# Patient Record
Sex: Female | Born: 1965 | Race: Black or African American | Hispanic: No | Marital: Single | Smoking: Never smoker
Health system: Southern US, Community
[De-identification: ages and names within clinical notes are randomized; demographics above are authoritative.]

## PROBLEM LIST (undated history)

## (undated) DIAGNOSIS — M5126 Other intervertebral disc displacement, lumbar region: Secondary | ICD-10-CM

## (undated) DIAGNOSIS — I2699 Other pulmonary embolism without acute cor pulmonale: Secondary | ICD-10-CM

## (undated) DIAGNOSIS — I10 Essential (primary) hypertension: Secondary | ICD-10-CM

## (undated) DIAGNOSIS — I519 Heart disease, unspecified: Secondary | ICD-10-CM

## (undated) DIAGNOSIS — I82409 Acute embolism and thrombosis of unspecified deep veins of unspecified lower extremity: Secondary | ICD-10-CM

## (undated) HISTORY — PX: BACK SURGERY: SHX140

## (undated) HISTORY — PX: ABDOMINAL HYSTERECTOMY: SHX81

## (undated) HISTORY — PX: OTHER SURGICAL HISTORY: SHX169

## (undated) HISTORY — PX: LUMBAR FUSION: SHX111

---

## 1998-04-12 ENCOUNTER — Encounter: Admission: RE | Admit: 1998-04-12 | Discharge: 1998-04-12 | Payer: Self-pay | Admitting: *Deleted

## 2000-06-09 ENCOUNTER — Encounter: Payer: Self-pay | Admitting: Emergency Medicine

## 2000-06-09 ENCOUNTER — Emergency Department (HOSPITAL_COMMUNITY): Admission: EM | Admit: 2000-06-09 | Discharge: 2000-06-09 | Payer: Self-pay | Admitting: Emergency Medicine

## 2000-06-25 ENCOUNTER — Emergency Department (HOSPITAL_COMMUNITY): Admission: EM | Admit: 2000-06-25 | Discharge: 2000-06-25 | Payer: Self-pay

## 2000-08-14 ENCOUNTER — Emergency Department (HOSPITAL_COMMUNITY): Admission: EM | Admit: 2000-08-14 | Discharge: 2000-08-14 | Payer: Self-pay | Admitting: Emergency Medicine

## 2000-08-18 ENCOUNTER — Emergency Department (HOSPITAL_COMMUNITY): Admission: EM | Admit: 2000-08-18 | Discharge: 2000-08-18 | Payer: Self-pay

## 2000-08-18 ENCOUNTER — Encounter: Payer: Self-pay | Admitting: Emergency Medicine

## 2000-12-08 ENCOUNTER — Emergency Department (HOSPITAL_COMMUNITY): Admission: EM | Admit: 2000-12-08 | Discharge: 2000-12-08 | Payer: Self-pay | Admitting: Emergency Medicine

## 2000-12-08 ENCOUNTER — Encounter: Payer: Self-pay | Admitting: Emergency Medicine

## 2001-05-10 ENCOUNTER — Emergency Department (HOSPITAL_COMMUNITY): Admission: EM | Admit: 2001-05-10 | Discharge: 2001-05-11 | Payer: Self-pay | Admitting: Emergency Medicine

## 2001-07-16 ENCOUNTER — Emergency Department (HOSPITAL_COMMUNITY): Admission: EM | Admit: 2001-07-16 | Discharge: 2001-07-16 | Payer: Self-pay | Admitting: Emergency Medicine

## 2002-01-17 ENCOUNTER — Emergency Department (HOSPITAL_COMMUNITY): Admission: EM | Admit: 2002-01-17 | Discharge: 2002-01-17 | Payer: Self-pay | Admitting: *Deleted

## 2002-06-24 ENCOUNTER — Emergency Department (HOSPITAL_COMMUNITY): Admission: EM | Admit: 2002-06-24 | Discharge: 2002-06-24 | Payer: Self-pay | Admitting: Emergency Medicine

## 2002-11-03 ENCOUNTER — Encounter: Payer: Self-pay | Admitting: Emergency Medicine

## 2002-11-03 ENCOUNTER — Emergency Department (HOSPITAL_COMMUNITY): Admission: EM | Admit: 2002-11-03 | Discharge: 2002-11-03 | Payer: Self-pay | Admitting: Emergency Medicine

## 2003-09-21 ENCOUNTER — Emergency Department (HOSPITAL_COMMUNITY): Admission: EM | Admit: 2003-09-21 | Discharge: 2003-09-21 | Payer: Self-pay | Admitting: Emergency Medicine

## 2003-10-01 ENCOUNTER — Emergency Department (HOSPITAL_COMMUNITY): Admission: EM | Admit: 2003-10-01 | Discharge: 2003-10-01 | Payer: Self-pay | Admitting: Emergency Medicine

## 2004-08-15 ENCOUNTER — Emergency Department (HOSPITAL_COMMUNITY): Admission: EM | Admit: 2004-08-15 | Discharge: 2004-08-15 | Payer: Self-pay | Admitting: Emergency Medicine

## 2004-09-08 ENCOUNTER — Emergency Department (HOSPITAL_COMMUNITY): Admission: EM | Admit: 2004-09-08 | Discharge: 2004-09-08 | Payer: Self-pay | Admitting: *Deleted

## 2004-11-30 ENCOUNTER — Emergency Department (HOSPITAL_COMMUNITY): Admission: EM | Admit: 2004-11-30 | Discharge: 2004-11-30 | Payer: Self-pay | Admitting: Emergency Medicine

## 2005-10-04 ENCOUNTER — Emergency Department (HOSPITAL_COMMUNITY): Admission: EM | Admit: 2005-10-04 | Discharge: 2005-10-04 | Payer: Self-pay | Admitting: Emergency Medicine

## 2006-03-02 ENCOUNTER — Emergency Department (HOSPITAL_COMMUNITY): Admission: EM | Admit: 2006-03-02 | Discharge: 2006-03-03 | Payer: Self-pay | Admitting: Emergency Medicine

## 2006-04-10 ENCOUNTER — Emergency Department (HOSPITAL_COMMUNITY): Admission: EM | Admit: 2006-04-10 | Discharge: 2006-04-11 | Payer: Self-pay | Admitting: Emergency Medicine

## 2006-07-19 ENCOUNTER — Emergency Department (HOSPITAL_COMMUNITY): Admission: EM | Admit: 2006-07-19 | Discharge: 2006-07-19 | Payer: Self-pay | Admitting: *Deleted

## 2007-10-06 ENCOUNTER — Emergency Department (HOSPITAL_COMMUNITY): Admission: EM | Admit: 2007-10-06 | Discharge: 2007-10-06 | Payer: Self-pay | Admitting: Emergency Medicine

## 2008-03-06 ENCOUNTER — Emergency Department (HOSPITAL_COMMUNITY): Admission: EM | Admit: 2008-03-06 | Discharge: 2008-03-07 | Payer: Self-pay | Admitting: Emergency Medicine

## 2008-11-14 ENCOUNTER — Encounter: Admission: RE | Admit: 2008-11-14 | Discharge: 2008-11-14 | Payer: Self-pay | Admitting: Neurosurgery

## 2008-12-21 ENCOUNTER — Emergency Department (HOSPITAL_COMMUNITY): Admission: EM | Admit: 2008-12-21 | Discharge: 2008-12-22 | Payer: Self-pay | Admitting: Emergency Medicine

## 2009-01-05 ENCOUNTER — Inpatient Hospital Stay (HOSPITAL_COMMUNITY): Admission: RE | Admit: 2009-01-05 | Discharge: 2009-01-09 | Payer: Self-pay | Admitting: Neurosurgery

## 2009-01-14 ENCOUNTER — Ambulatory Visit: Payer: Self-pay | Admitting: Family Medicine

## 2009-01-14 ENCOUNTER — Ambulatory Visit: Payer: Self-pay | Admitting: Vascular Surgery

## 2009-01-14 ENCOUNTER — Encounter (INDEPENDENT_AMBULATORY_CARE_PROVIDER_SITE_OTHER): Payer: Self-pay | Admitting: Emergency Medicine

## 2009-01-14 ENCOUNTER — Inpatient Hospital Stay (HOSPITAL_COMMUNITY): Admission: EM | Admit: 2009-01-14 | Discharge: 2009-01-16 | Payer: Self-pay | Admitting: Emergency Medicine

## 2009-01-17 ENCOUNTER — Telehealth: Payer: Self-pay | Admitting: Family Medicine

## 2009-01-18 DIAGNOSIS — I2699 Other pulmonary embolism without acute cor pulmonale: Secondary | ICD-10-CM

## 2009-01-21 ENCOUNTER — Emergency Department (HOSPITAL_COMMUNITY): Admission: EM | Admit: 2009-01-21 | Discharge: 2009-01-21 | Payer: Self-pay | Admitting: Emergency Medicine

## 2009-07-04 ENCOUNTER — Inpatient Hospital Stay (HOSPITAL_COMMUNITY): Admission: AD | Admit: 2009-07-04 | Discharge: 2009-07-04 | Payer: Self-pay | Admitting: Family Medicine

## 2009-07-05 ENCOUNTER — Emergency Department (HOSPITAL_COMMUNITY): Admission: EM | Admit: 2009-07-05 | Discharge: 2009-07-05 | Payer: Self-pay | Admitting: Emergency Medicine

## 2009-08-26 ENCOUNTER — Ambulatory Visit: Payer: Self-pay | Admitting: Internal Medicine

## 2009-08-26 ENCOUNTER — Observation Stay (HOSPITAL_COMMUNITY): Admission: EM | Admit: 2009-08-26 | Discharge: 2009-08-27 | Payer: Self-pay | Admitting: Emergency Medicine

## 2009-11-22 ENCOUNTER — Emergency Department (HOSPITAL_COMMUNITY): Admission: EM | Admit: 2009-11-22 | Discharge: 2009-11-22 | Payer: Self-pay | Admitting: Emergency Medicine

## 2009-12-11 ENCOUNTER — Emergency Department (HOSPITAL_COMMUNITY): Admission: EM | Admit: 2009-12-11 | Discharge: 2009-12-11 | Payer: Self-pay | Admitting: Emergency Medicine

## 2010-02-27 ENCOUNTER — Emergency Department (HOSPITAL_COMMUNITY): Admission: EM | Admit: 2010-02-27 | Discharge: 2010-02-27 | Payer: Self-pay | Admitting: Emergency Medicine

## 2010-04-07 ENCOUNTER — Emergency Department (HOSPITAL_COMMUNITY): Admission: EM | Admit: 2010-04-07 | Discharge: 2010-04-07 | Payer: Self-pay | Admitting: Emergency Medicine

## 2010-10-29 IMAGING — CT CT L SPINE W/ CM
4 of 8 series · 14 of 33 positions shown, 16 images · non-contrast
Comparison: none

CLINICAL DATA: Low back and right lower extremity pain.  No
previous surgery.

LUMBAR DISCOGRAM [id number]:
CT LUMBAR SPINE WITH INTRADISCAL CONTRAST
CT MULTIPLANAR RECONSTRUCTION
TECHNIQUE: Lumbar region prepped with Betadine, draped in usual
sterile fashion, and infiltrated locally with 1% lidocaine.
Intravenous   Versed   administered as anxiolytic   during
continuous cardiorespiratory monitoring by the radiology RN. Curved
22 gauge Tshepy Hull advanced into the L2-3, L3-4, L4-5, and L5-
S1 interspaces using a left posterolateral approach. The levels
were sequentially injected with water-soluble contrast during
continuous fluoroscopic and pressure monitoring. Spot radiographs
were taken. The patient's symptoms were recorded.  The needles were
removed and the patient transferred to CT for helical scanning
through these levels. Coronal and sagittal reconstructions were
generated from the axial scan.

[Series 2: l spine · axial · 0.27mm/px · z∈[-276,-186]mm · 3 of 73 slices shown, 4 images]
[im 19/73  soft-tissue]
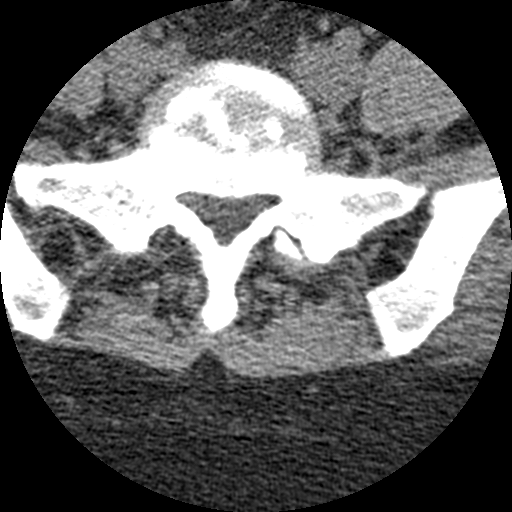
[im 19/73  bone]
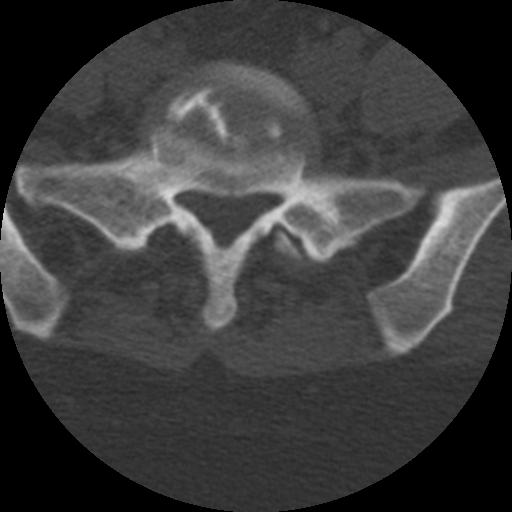
[im 37/73  bone]
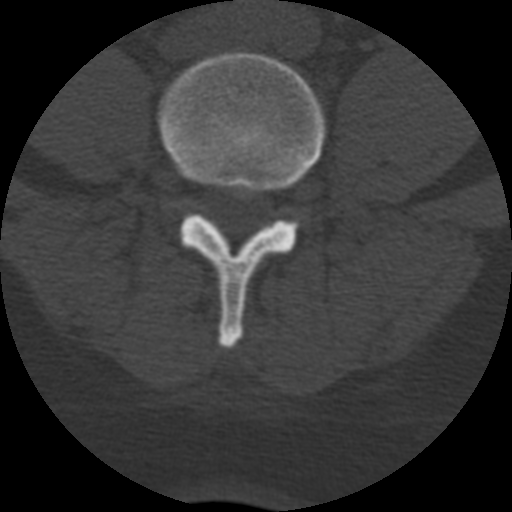
[im 55/73  bone]
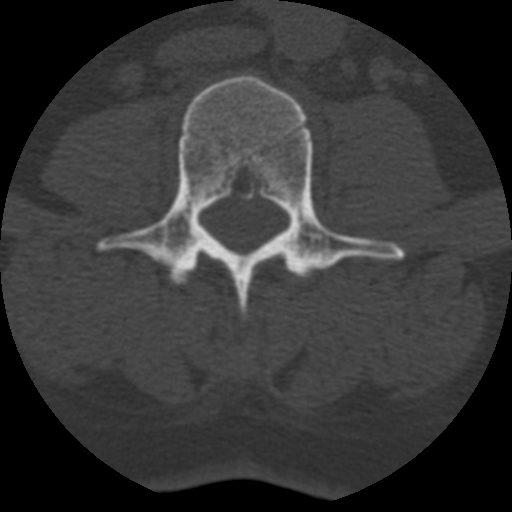

[Series 3: bone windows · axial · 0.27mm/px · z∈[-276,-186]mm · 3 of 73 slices shown]
[im 19/73  bone]
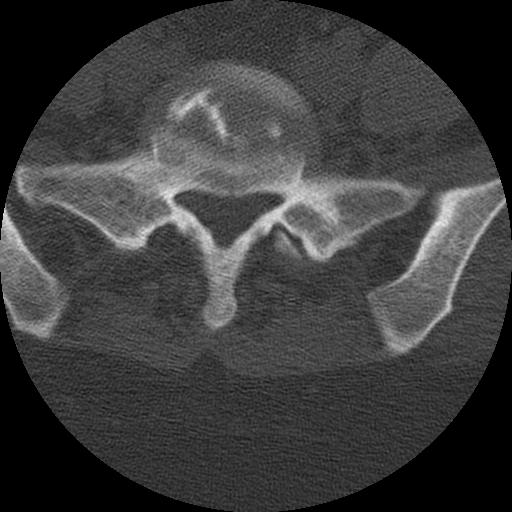
[im 37/73  bone]
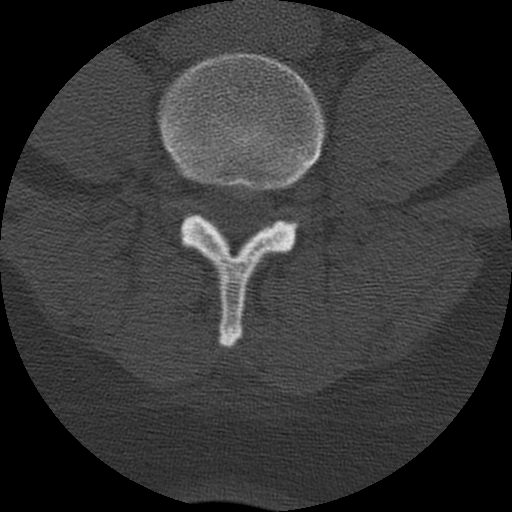
[im 55/73  bone]
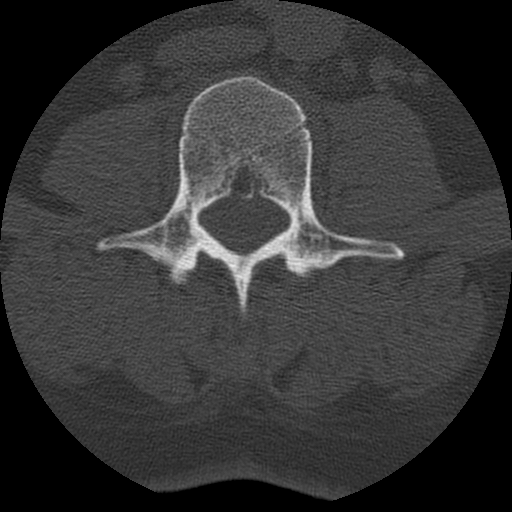

[Series 400: cor · coronal · 0.36mm/px · 3 of 40 slices shown]
[im 8/40  bone]
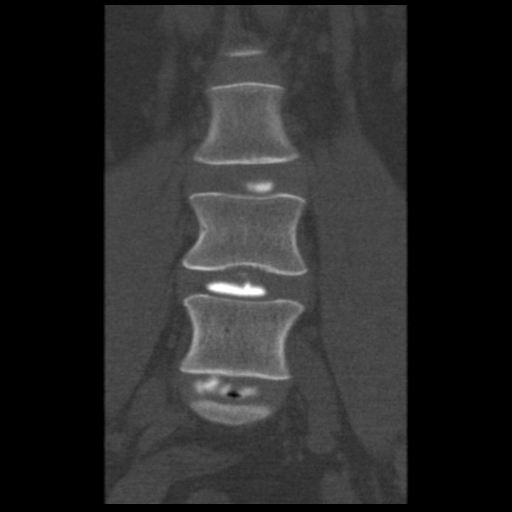
[im 16/40  bone]
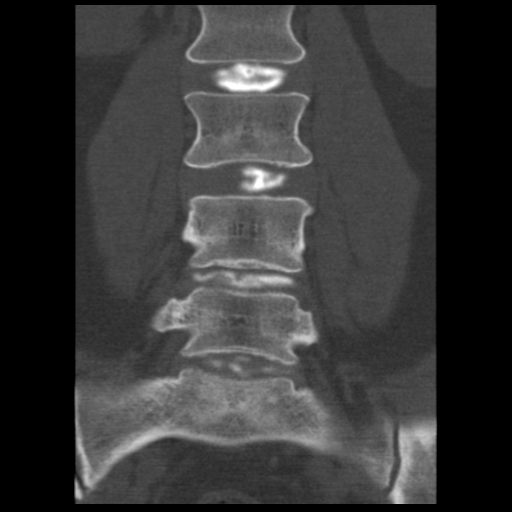
[im 24/40  bone]
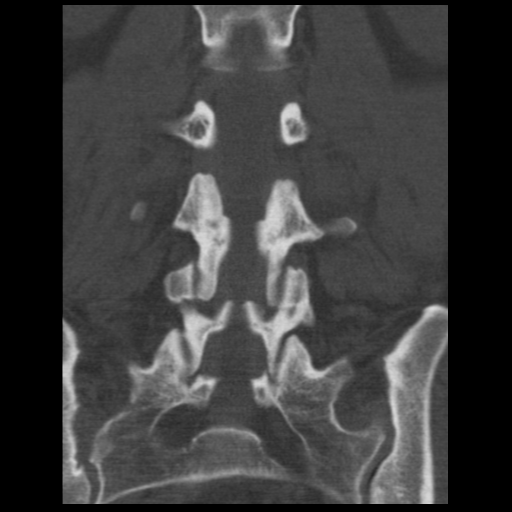

[Series 401: sag · sagittal · 0.36mm/px · 5 of 40 slices shown, 6 images]
[im 14/40  bone]
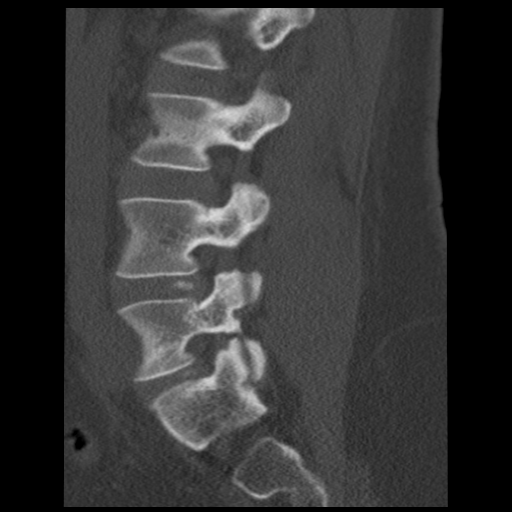
[im 17/40  bone]
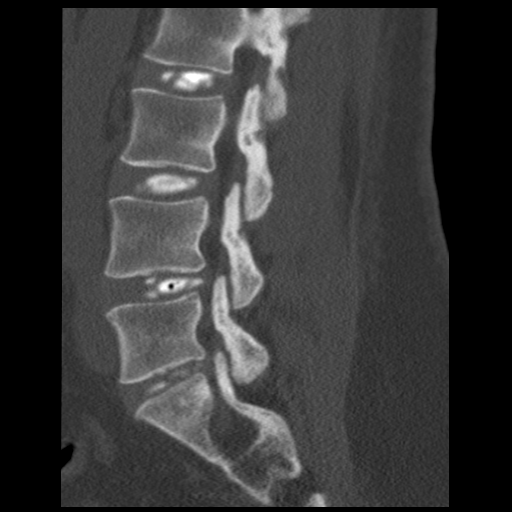
[im 20/40  soft-tissue]
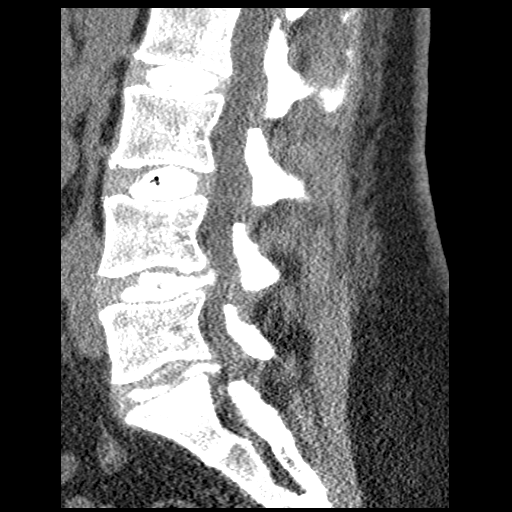
[im 20/40  bone]
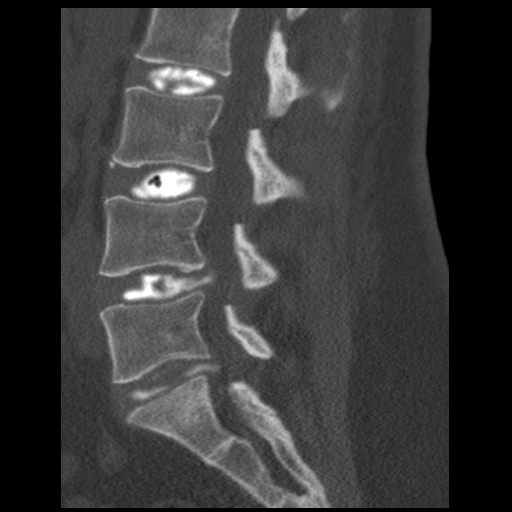
[im 23/40  bone]
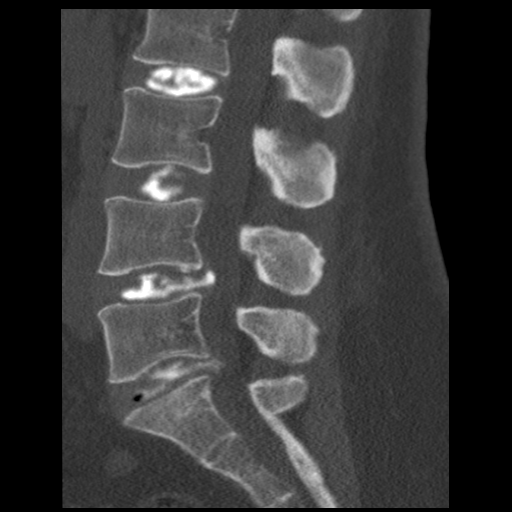
[im 27/40  bone]
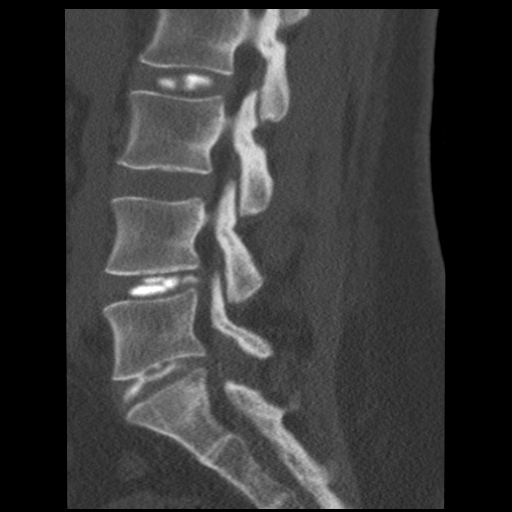

[14 of 33 positions shown; findings below may reference images not displayed]

FINDINGS: L2-3: Opening pressure 25 PSI, rising to 100 PSI after 2 ml
contrast.  There is no pain response.  Discography shows normal
nuclear spread of contrast.  CT discography confirms normal nuclear
spread of contrast.

L3-4:  Opening pressure 2 PSI, rising to 100 PSI after 2.5 ml
contrast.  No pain response.  Discography shows normal nuclear
spread of contrast.  CT discography confirms normal nuclear spread
of contrast.

L4-5:  Opening pressure 10 PSI, rising to 20 PSI after 2 ml
contrast, at which patient point the patient describes provocation
of central low back pain without lateralization or radicular
component, concordant with a component of her usual symptoms, rated
10 of 10 on the visual analogue pain scale.  Discography showed a
posterior annular defect with contrast extension.  CT   discography
confirms a central posterior annular defect with extension of
contrast into a broad posterior protrusion.

L5-S1:  Opening pressure 20 PSI, rising to only 30 PSI after 1.5 ml
contrast, at which point the patient describes provocation of
central low back pain, not lateralized, without radicular
component, rated 10 of 10.  Discography was relatively limited due
to the low contrast volume and no discrete annular defect was
evident.  However, CT discography demonstrates to advantage a left
posterolateral fairly broad annular defect with extraannular
extension of contrast  into a left broad posterolateral protrusion.
IMPRESSION: 1.  Normal discography L2-3 and L3-4.
2.  Abnormal discography L4-5 with a concordant pain response and a
broad central protrusion.
3.  Abnormal discography L5-S1 with a concordant pain response and
a broad left paracentral protrusion.

REF:G1 DICTATED: 11/14/2008 [DATE]

## 2010-10-30 NOTE — Progress Notes (Signed)
Summary: phn msg  Phone Note Call from Patient Call back at [facility] Phone [phone]   Caller: Patient Summary of Call: did not get her lovonox shot [facility]. Initial call taken by: De Nurse,  January 17, 2009 10:52 AM  Follow-up for Phone Call        Did the patient come to the clinic for INR test? The hospital social worker arranged for the patient to get her Lovenox, so I will speak with her this am. Please advise patient to come in for INR testing. Please stress the importance of this test when taking coumadin.  Follow-up by: Helane Rima MD,  January 18, 2009 6:44 AM      Appended Document: phn msg  When I spoke w/ pt yesterday morning, I said that she had appt for labs and she was aware of it.  I will call her today to make sure she can come back in. De Nurse  Appended Document: phn msg Please call patient to come in ASAP for INR check and to sign paper for free Lovenox at hospital. Tell her to bring her prescription.   Appended Document: Orders Update    Clinical Lists Changes  Problems: Added new problem of PE (ICD-415.19) Orders: Added new Test order of INR/PT-[facility] (14782) - Signed      Appended Document: phn msg Dr. Earlene Plater will follow.

## 2010-12-23 LAB — POCT CARDIAC MARKERS
CKMB, poc: <1 ng/mL — ABNORMAL LOW (ref 1.0–8.0)
Myoglobin, poc: 44.3 ng/mL (ref 12–200)
Troponin i, poc: <0.05 ng/mL (ref 0.00–0.09)

## 2010-12-23 LAB — D-DIMER, QUANTITATIVE

## 2010-12-23 LAB — POCT I-STAT, CHEM 8
Calcium, Ion: 1.19 mmol/L (ref 1.12–1.32)
HCT: 35 % — ABNORMAL LOW (ref 36.0–46.0)
Hemoglobin: 11.9 g/dL — ABNORMAL LOW (ref 12.0–15.0)
TCO2: 30 mmol/L (ref 0–100)

## 2010-12-23 LAB — CBC
MCHC: 33.1 g/dL (ref 30.0–36.0)
MCV: 88.8 fL (ref 78.0–100.0)
Platelets: 187 10*3/uL (ref 150–400)
RDW: 13.2 % (ref 11.5–15.5)
WBC: 4.2 10*3/uL (ref 4.0–10.5)

## 2011-01-02 LAB — CBC
HCT: 32.7 % — ABNORMAL LOW (ref 36.0–46.0)
HCT: 32.7 % — ABNORMAL LOW (ref 36.0–46.0)
Hemoglobin: 11.3 g/dL — ABNORMAL LOW (ref 12.0–15.0)
MCV: 89 fL (ref 78.0–100.0)
Platelets: 220 10*3/uL (ref 150–400)
RBC: 3.69 MIL/uL — ABNORMAL LOW (ref 3.87–5.11)
RDW: 12.7 % (ref 11.5–15.5)
RDW: 13.1 % (ref 11.5–15.5)
WBC: 3.7 10*3/uL — ABNORMAL LOW (ref 4.0–10.5)

## 2011-01-02 LAB — BASIC METABOLIC PANEL
BUN: 11 mg/dL (ref 6–23)
CO2: 26 mEq/L (ref 19–32)
Calcium: 8.8 mg/dL (ref 8.4–10.5)
Chloride: 109 mEq/L (ref 96–112)
Creatinine, Ser: 0.58 mg/dL (ref 0.4–1.2)
GFR calc Af Amer: >60 mL/min (ref >60)
GFR calc non Af Amer: >60 mL/min (ref >60)
Potassium: 3.8 mEq/L (ref 3.5–5.1)
Sodium: 140 mEq/L (ref 135–145)

## 2011-01-02 LAB — POCT I-STAT, CHEM 8
BUN: 11 mg/dL (ref 6–23)
Calcium, Ion: 1.15 mmol/L (ref 1.12–1.32)
Chloride: 104 mEq/L (ref 96–112)
Creatinine, Ser: 0.8 mg/dL (ref 0.4–1.2)
Glucose, Bld: 90 mg/dL (ref 70–99)
HCT: 35 % — ABNORMAL LOW (ref 36.0–46.0)
Hemoglobin: 11.9 g/dL — ABNORMAL LOW (ref 12.0–15.0)
Potassium: 3.4 meq/L — ABNORMAL LOW (ref 3.5–5.1)
Sodium: 139 meq/L (ref 135–145)
TCO2: 23 mmol/L (ref 0–100)

## 2011-01-02 LAB — COMPREHENSIVE METABOLIC PANEL
Albumin: 3.6 g/dL (ref 3.5–5.2)
Alkaline Phosphatase: 53 U/L (ref 39–117)
BUN: 14 mg/dL (ref 6–23)
Chloride: 101 mEq/L (ref 96–112)
Creatinine, Ser: 0.98 mg/dL (ref 0.4–1.2)
Glucose, Bld: 100 mg/dL — ABNORMAL HIGH (ref 70–99)
Total Bilirubin: 0.5 mg/dL (ref 0.3–1.2)

## 2011-01-02 LAB — GLUCOSE, CAPILLARY: Glucose-Capillary: 95 mg/dL (ref 70–99)

## 2011-01-02 LAB — PROTIME-INR: Prothrombin Time: 15.4 seconds — ABNORMAL HIGH (ref 11.6–15.2)

## 2011-01-02 LAB — HEMOGLOBIN A1C
Hgb A1c MFr Bld: 5.6 % (ref 4.6–6.1)
Mean Plasma Glucose: 114 mg/dL

## 2011-01-02 LAB — IRON AND TIBC
Saturation Ratios: 44 % (ref 20–55)
TIBC: 283 ug/dL (ref 250–470)
UIBC: 158 ug/dL

## 2011-01-02 LAB — DIFFERENTIAL
Basophils Absolute: 0 10*3/uL (ref 0.0–0.1)
Eosinophils Absolute: 0.1 10*3/uL (ref 0.0–0.7)
Eosinophils Relative: 2 % (ref 0–5)
Monocytes Absolute: 0.3 10*3/uL (ref 0.1–1.0)

## 2011-01-02 LAB — RETICULOCYTES
RBC.: 3.86 MIL/uL — ABNORMAL LOW (ref 3.87–5.11)
Retic Count, Absolute: 65.6 10*3/uL (ref 19.0–186.0)

## 2011-01-02 LAB — TSH: TSH: 0.742 u[IU]/mL (ref 0.350–4.500)

## 2011-01-02 LAB — VITAMIN B12: Vitamin B-12: 442 pg/mL (ref 211–911)

## 2011-01-02 LAB — LIPID PANEL
Cholesterol: 228 mg/dL — ABNORMAL HIGH (ref 0–200)
HDL: 52 mg/dL (ref >39)

## 2011-01-02 LAB — FOLATE: Folate: 15.3 ng/mL

## 2011-01-02 LAB — FOLATE RBC: RBC Folate: 815 ng/mL — ABNORMAL HIGH (ref 180–600)

## 2011-01-02 LAB — MAGNESIUM: Magnesium: 2.1 mg/dL (ref 1.5–2.5)

## 2011-01-03 LAB — POCT I-STAT, CHEM 8
BUN: 13 mg/dL (ref 6–23)
Calcium, Ion: 1.22 mmol/L (ref 1.12–1.32)
Chloride: 102 mEq/L (ref 96–112)
Glucose, Bld: 92 mg/dL (ref 70–99)

## 2011-01-03 LAB — CBC
HCT: 32.8 % — ABNORMAL LOW (ref 36.0–46.0)
MCV: 88.3 fL (ref 78.0–100.0)
Platelets: 200 10*3/uL (ref 150–400)
WBC: 3.2 10*3/uL — ABNORMAL LOW (ref 4.0–10.5)

## 2011-01-03 LAB — WET PREP, GENITAL
Clue Cells Wet Prep HPF POC: NONE SEEN
Trich, Wet Prep: NONE SEEN
Yeast Wet Prep HPF POC: NONE SEEN

## 2011-01-03 LAB — GC/CHLAMYDIA PROBE AMP, GENITAL
Chlamydia, DNA Probe: NEGATIVE
GC Probe Amp, Genital: NEGATIVE

## 2011-01-03 LAB — PROTIME-INR: INR: 1.86 — ABNORMAL HIGH (ref 0.00–1.49)

## 2011-01-09 LAB — COMPREHENSIVE METABOLIC PANEL
ALT: 13 U/L (ref 0–35)
ALT: 13 U/L (ref 0–35)
AST: 16 U/L (ref 0–37)
AST: 21 U/L (ref 0–37)
Albumin: 2.4 g/dL — ABNORMAL LOW (ref 3.5–5.2)
Albumin: 2.6 g/dL — ABNORMAL LOW (ref 3.5–5.2)
Alkaline Phosphatase: 39 U/L (ref 39–117)
Alkaline Phosphatase: 44 U/L (ref 39–117)
BUN: 11 mg/dL (ref 6–23)
BUN: 13 mg/dL (ref 6–23)
CO2: 25 mEq/L (ref 19–32)
CO2: 25 mEq/L (ref 19–32)
Calcium: 8.4 mg/dL (ref 8.4–10.5)
Calcium: 8.8 mg/dL (ref 8.4–10.5)
Chloride: 101 mEq/L (ref 96–112)
Chloride: 104 mEq/L (ref 96–112)
Creatinine, Ser: 0.87 mg/dL (ref 0.4–1.2)
Creatinine, Ser: 0.94 mg/dL (ref 0.4–1.2)
GFR calc Af Amer: >60 mL/min (ref >60)
GFR calc Af Amer: >60 mL/min (ref >60)
GFR calc non Af Amer: >60 mL/min (ref >60)
GFR calc non Af Amer: >60 mL/min (ref >60)
Glucose, Bld: 108 mg/dL — ABNORMAL HIGH (ref 70–99)
Glucose, Bld: 97 mg/dL (ref 70–99)
Potassium: 3.4 mEq/L — ABNORMAL LOW (ref 3.5–5.1)
Potassium: 3.6 mEq/L (ref 3.5–5.1)
Sodium: 135 mEq/L (ref 135–145)
Sodium: 137 mEq/L (ref 135–145)
Total Bilirubin: 0.3 mg/dL (ref 0.3–1.2)
Total Bilirubin: <0.1 mg/dL — ABNORMAL LOW (ref 0.3–1.2)
Total Protein: 5.9 g/dL — ABNORMAL LOW (ref 6.0–8.3)
Total Protein: 6.3 g/dL (ref 6.0–8.3)

## 2011-01-09 LAB — CBC
HCT: 27.5 % — ABNORMAL LOW (ref 36.0–46.0)
HCT: 28.9 % — ABNORMAL LOW (ref 36.0–46.0)
HCT: 28.5 % — ABNORMAL LOW (ref 36.0–46.0)
HCT: 30.4 % — ABNORMAL LOW (ref 36.0–46.0)
HCT: 34.6 % — ABNORMAL LOW (ref 36.0–46.0)
Hemoglobin: 9.4 g/dL — ABNORMAL LOW (ref 12.0–15.0)
Hemoglobin: 9.7 g/dL — ABNORMAL LOW (ref 12.0–15.0)
Hemoglobin: 11.7 g/dL — ABNORMAL LOW (ref 12.0–15.0)
Hemoglobin: 9.6 g/dL — ABNORMAL LOW (ref 12.0–15.0)
MCHC: 33.7 g/dL (ref 30.0–36.0)
MCHC: 34.3 g/dL (ref 30.0–36.0)
MCHC: 33.7 g/dL (ref 30.0–36.0)
MCHC: 33.9 g/dL (ref 30.0–36.0)
MCV: 86.7 fL (ref 78.0–100.0)
MCV: 87.8 fL (ref 78.0–100.0)
MCV: 88.1 fL (ref 78.0–100.0)
MCV: 88.1 fL (ref 78.0–100.0)
Platelets: 289 10*3/uL (ref 150–400)
Platelets: 300 10*3/uL (ref 150–400)
Platelets: 180 10*3/uL (ref 150–400)
Platelets: 197 10*3/uL (ref 150–400)
RBC: 3.17 MIL/uL — ABNORMAL LOW (ref 3.87–5.11)
RBC: 3.28 MIL/uL — ABNORMAL LOW (ref 3.87–5.11)
RBC: 3.57 MIL/uL — ABNORMAL LOW (ref 3.87–5.11)
RBC: 3.94 MIL/uL (ref 3.87–5.11)
RDW: 12.2 % (ref 11.5–15.5)
RDW: 12.3 % (ref 11.5–15.5)
RDW: 12.5 % (ref 11.5–15.5)
RDW: 12.4 % (ref 11.5–15.5)
WBC: 4.8 10*3/uL (ref 4.0–10.5)
WBC: 5.1 10*3/uL (ref 4.0–10.5)

## 2011-01-09 LAB — BASIC METABOLIC PANEL
BUN: 9 mg/dL (ref 6–23)
CO2: 27 mEq/L (ref 19–32)
CO2: 29 mEq/L (ref 19–32)
Chloride: 101 mEq/L (ref 96–112)
Chloride: 103 mEq/L (ref 96–112)
Creatinine, Ser: 0.87 mg/dL (ref 0.4–1.2)
Creatinine, Ser: 0.95 mg/dL (ref 0.4–1.2)
GFR calc Af Amer: >60 mL/min (ref >60)
Glucose, Bld: 101 mg/dL — ABNORMAL HIGH (ref 70–99)
Potassium: 3.7 mEq/L (ref 3.5–5.1)
Sodium: 138 mEq/L (ref 135–145)

## 2011-01-09 LAB — POCT I-STAT, CHEM 8
Chloride: 105 mEq/L (ref 96–112)
HCT: 32 % — ABNORMAL LOW (ref 36.0–46.0)
Hemoglobin: 10.9 g/dL — ABNORMAL LOW (ref 12.0–15.0)
Potassium: 3.6 mEq/L (ref 3.5–5.1)
Sodium: 139 mEq/L (ref 135–145)

## 2011-01-09 LAB — POCT CARDIAC MARKERS
CKMB, poc: <1 ng/mL — ABNORMAL LOW (ref 1.0–8.0)
Troponin i, poc: <0.05 ng/mL (ref 0.00–0.09)

## 2011-01-09 LAB — FERRITIN: Ferritin: 87 ng/mL (ref 10–291)

## 2011-01-09 LAB — HCG, QUANTITATIVE, PREGNANCY: hCG, Beta Chain, Quant, S: <2 m[IU]/mL (ref <5)

## 2011-01-09 LAB — PROTIME-INR
INR: 1 (ref 0.00–1.49)
INR: 1.4 (ref 0.00–1.49)
Prothrombin Time: 13.5 seconds (ref 11.6–15.2)

## 2011-01-09 LAB — IRON AND TIBC
Iron: 67 ug/dL (ref 42–135)
UIBC: 198 ug/dL

## 2011-01-09 LAB — DIFFERENTIAL
Basophils Absolute: 0 10*3/uL (ref 0.0–0.1)
Basophils Relative: 0 % (ref 0–1)
Neutro Abs: 3.3 10*3/uL (ref 1.7–7.7)
Neutrophils Relative %: 68 % (ref 43–77)

## 2011-01-09 LAB — VITAMIN B12: Vitamin B-12: 298 pg/mL (ref 211–911)

## 2011-01-09 LAB — CARDIAC PANEL(CRET KIN+CKTOT+MB+TROPI): CK, MB: 3 ng/mL (ref 0.3–4.0)

## 2011-01-09 LAB — T3, FREE: T3, Free: 2.2 pg/mL — ABNORMAL LOW (ref 2.3–4.2)

## 2011-01-24 ENCOUNTER — Other Ambulatory Visit (HOSPITAL_COMMUNITY): Payer: Medicare Other

## 2011-01-25 ENCOUNTER — Other Ambulatory Visit: Payer: Self-pay | Admitting: Obstetrics & Gynecology

## 2011-01-25 ENCOUNTER — Other Ambulatory Visit (HOSPITAL_COMMUNITY): Payer: Self-pay | Admitting: Obstetrics & Gynecology

## 2011-01-25 ENCOUNTER — Ambulatory Visit (HOSPITAL_COMMUNITY)
Admission: RE | Admit: 2011-01-25 | Discharge: 2011-01-25 | Disposition: A | Discharge status: Home / Self Care | Payer: Medicare Other | Source: Ambulatory Visit | Attending: Obstetrics & Gynecology | Admitting: Obstetrics & Gynecology

## 2011-01-25 ENCOUNTER — Encounter (HOSPITAL_COMMUNITY): Payer: Medicare Other

## 2011-01-25 DIAGNOSIS — N949 Unspecified condition associated with female genital organs and menstrual cycle: Secondary | ICD-10-CM | POA: Insufficient documentation

## 2011-01-25 DIAGNOSIS — Z79899 Other long term (current) drug therapy: Secondary | ICD-10-CM | POA: Insufficient documentation

## 2011-01-25 DIAGNOSIS — Z01818 Encounter for other preprocedural examination: Secondary | ICD-10-CM | POA: Insufficient documentation

## 2011-01-25 DIAGNOSIS — D259 Leiomyoma of uterus, unspecified: Secondary | ICD-10-CM | POA: Insufficient documentation

## 2011-01-25 DIAGNOSIS — Z01812 Encounter for preprocedural laboratory examination: Secondary | ICD-10-CM | POA: Insufficient documentation

## 2011-01-25 DIAGNOSIS — I10 Essential (primary) hypertension: Secondary | ICD-10-CM | POA: Insufficient documentation

## 2011-01-25 DIAGNOSIS — R9431 Abnormal electrocardiogram [ECG] [EKG]: Secondary | ICD-10-CM | POA: Insufficient documentation

## 2011-01-25 DIAGNOSIS — Z01810 Encounter for preprocedural cardiovascular examination: Secondary | ICD-10-CM | POA: Insufficient documentation

## 2011-01-25 DIAGNOSIS — Z86718 Personal history of other venous thrombosis and embolism: Secondary | ICD-10-CM | POA: Insufficient documentation

## 2011-01-25 LAB — CBC
Hemoglobin: 11.8 g/dL — ABNORMAL LOW (ref 12.0–15.0)
MCH: 28.8 pg (ref 26.0–34.0)
MCV: 88.5 fL (ref 78.0–100.0)
RBC: 4.1 MIL/uL (ref 3.87–5.11)
WBC: 4 10*3/uL (ref 4.0–10.5)

## 2011-01-25 LAB — COMPREHENSIVE METABOLIC PANEL
ALT: 16 U/L (ref 0–35)
AST: 18 U/L (ref 0–37)
Albumin: 4.2 g/dL (ref 3.5–5.2)
CO2: 29 mEq/L (ref 19–32)
Calcium: 9.5 mg/dL (ref 8.4–10.5)
Creatinine, Ser: 0.94 mg/dL (ref 0.4–1.2)
GFR calc Af Amer: >60 mL/min (ref >60)
GFR calc non Af Amer: >60 mL/min (ref >60)
Sodium: 139 mEq/L (ref 135–145)

## 2011-01-25 LAB — SURGICAL PCR SCREEN: MRSA, PCR: NEGATIVE

## 2011-01-25 LAB — PROTIME-INR: Prothrombin Time: 13.5 seconds (ref 11.6–15.2)

## 2011-01-25 LAB — APTT: aPTT: 31 seconds (ref 24–37)

## 2011-02-01 ENCOUNTER — Observation Stay (HOSPITAL_COMMUNITY)
Admission: RE | Admit: 2011-02-01 | Discharge: 2011-02-02 | Disposition: A | Discharge status: Home / Self Care | Payer: Non-veteran care | Source: Ambulatory Visit | Attending: Obstetrics & Gynecology | Admitting: Obstetrics & Gynecology

## 2011-02-01 ENCOUNTER — Other Ambulatory Visit: Payer: Self-pay | Admitting: Obstetrics & Gynecology

## 2011-02-01 ENCOUNTER — Ambulatory Visit (HOSPITAL_COMMUNITY)
Admission: RE | Admit: 2011-02-01 | Payer: Medicare Other | Source: Ambulatory Visit | Admitting: Obstetrics & Gynecology

## 2011-02-01 DIAGNOSIS — I10 Essential (primary) hypertension: Secondary | ICD-10-CM | POA: Insufficient documentation

## 2011-02-01 DIAGNOSIS — N736 Female pelvic peritoneal adhesions (postinfective): Secondary | ICD-10-CM | POA: Insufficient documentation

## 2011-02-01 DIAGNOSIS — D259 Leiomyoma of uterus, unspecified: Principal | ICD-10-CM | POA: Insufficient documentation

## 2011-02-01 DIAGNOSIS — Z86711 Personal history of pulmonary embolism: Secondary | ICD-10-CM | POA: Insufficient documentation

## 2011-02-01 DIAGNOSIS — N920 Excessive and frequent menstruation with regular cycle: Secondary | ICD-10-CM | POA: Insufficient documentation

## 2011-02-02 LAB — CBC
HCT: 30.4 % — ABNORMAL LOW (ref 36.0–46.0)
Hemoglobin: 10.1 g/dL — ABNORMAL LOW (ref 12.0–15.0)
MCV: 87.9 fL (ref 78.0–100.0)
RBC: 3.46 MIL/uL — ABNORMAL LOW (ref 3.87–5.11)
WBC: 7.6 10*3/uL (ref 4.0–10.5)

## 2011-02-12 NOTE — H&P (Signed)
[name], [name]               ACCOUNT NO.:  [id number]   MEDICAL RECORD NO.:  [id number]          PATIENT TYPE:  INP   LOCATION:  1825                         FACILITY:  [facility]   PHYSICIAN:  Paula Compton, MD        DATE OF BIRTH:  1966-06-15   DATE OF ADMISSION:  01/14/2009  DATE OF DISCHARGE:                              HISTORY & PHYSICAL   CHIEF COMPLAINT:  Left-sided chest pain and calf pain.   PRIMARY CARE PHYSICIAN:  Dr. Olene Floss at [facility]   HISTORY OF PRESENT ILLNESS:  [name] is a 45 year old female, status  post L4-L5 decompression fusion on January 06, 2009, who presented to the  emergency department with 1-day history of left-sided chest pain,  shortness of breath and calf pain.  [name] described the shortness of  breath as feeling heavy.  It was also painful with deep inspiration, and  that was made better with Percocet.  [name] says that this feeling was  constant.  [name] also complains of bilateral calf pain with right more  painful than left.  The patient has no history of blood clots in the  past.  [name] is a nonsmoker, but [name] has taken birth control pills for  uterine fibroids.  [name] also has a family history of blood clots  including her aunt, uncle, and mother.  [name] endorses some nausea but no  vomiting, and at this time, the shortness of breath has resolved.   PAST MEDICAL HISTORY:  Hypertension, depression, GERD, history of  uterine fibroids, anemia.   PAST SURGICAL HISTORY:  L4-L5 decompression fusion on January 06, 2009,  umbilical hernia repair about 3 years ago.   SOCIAL HISTORY:  The patient lives in [place] with her son.  [name] is  single.  [name] does not smoke, drink or do any drugs.   FAMILY HISTORY:  Blood clots as described above.   MEDICATIONS:  The patient is presenting without her medications in the  emergency department, and so we are unable to have a complete list at  this time, but we do know that these medications include Effexor,  hydrochlorothiazide, Neurontin, Protonix, Valium and Vicodin.  The  patient's son is going home to get her pill bottles to bring them for  clarification.   ALLERGIES:  OXYCODONE at higher doses causes bad dreams.   REVIEW OF SYSTEMS:  Per patient, review of systems above per HPI,  otherwise negative.   PHYSICAL EXAMINATION:  VITAL SIGNS:  Temperature 98.3, pulse 95,  respirations 20, BP 115/77.  GENERAL:  [name] is alert and oriented x3.  [name] is in no apparent distress.  HEENT:  Normocephalic, atraumatic.  Pupils are equal, round and reactive  to light.  Extraocular muscles are intact.  NECK:  Supple.  Full range of motion.  No masses.  No JVD.  CARDIOVASCULAR:  Regular rate and rhythm, no murmurs, rubs or gallops.  LUNGS:  Clear to auscultation bilaterally.  ABDOMEN:  Soft, nontender, nondistended.  Bowel sounds x4.  BACK:  Status post laminectomy with incision clean, dry, intact.  EXTREMITY:  No cyanosis, clubbing or  edema.  [name] is tender to palpation  along her right calf.  MUSCULOSKELETAL:  [name] has difficulty with sitting up, status post  laminectomy.  SKIN:  No rashes.   LABORATORIES AND STUDIES:  CT angio of the chest January 14, 2009.  Impression:  Extremely tiny filling defect at the bifurcation of  subsegmental pulmonary arteries to the posterior right lower lobe  suspicious for a tiny PE.  Chest x-ray:  No acute infiltration or edema.  Stable central mild peribronchial thickening.  Bilateral lower extremity  Dopplers:  No obvious DVT,  SVT, or Baker's cyst. Flow is sluggish  through both extremities, and color does not completely fill the  vessels.  This is an environment inconclusive to form clots.  White  blood cells 4.9, hemoglobin 10.6, hematocrit 31.4, platelets 300.  Point-  of-care cardiac enzymes are negative.  Sodium 139, potassium 3.6,  chloride 105, CO2 of 26, BUN 10, creatinine 1, glucose 87.   ASSESSMENT AND PLAN:  This is a 45 year old female with presumed   pulmonary embolus.  1. Pulmonary embolus/deep venous thrombosis.  The patient has      shortness of breath and left-sided chest pain with deep      inspiration.  A pulmonary embolus/deep venous thrombosis cannot be      excluded by testing as above.  The patient is set up for these      problems with her risk factors including recent surgery, estrogen      use, family history and sluggish flow through her legs.  The      patient will be admitted for anticoagulation with bridging from      Lovenox to Coumadin.  We will discuss with the team regarding need      for hypercoagulable state coagulation workup.  2. Status post back surgery/pain.  The patient is taking Vicodin for      pain.  Her neurosurgeon is aware of her admission.  We will monitor      her for bleeding as we will be anticoagulating her.  3. Psychiatric.  The patient has a history of depression and possibly      some other psychiatric issues.  We will treat her with her home      dose of Effexor once her son brings in her pill bottles tonight as      well as Valium.  4. Gastroesophageal reflux disease.  The patient will continue the      patient's home dose of Protonix.  5. Uterine fibroids.  The patient is said to be on estrogen.  We will      hold this and clarify once we can evaluate her medicines.  6. Fluids/electrolytes/nutrition/gastrointestinal.  Regular diet.      Monitor electrolytes.  7. Prophylaxis.  Give proton pump inhibitor for peptic ulcer disease,      Lovenox for deep venous thrombosis.   DISPOSITION:  Pending anticoagulation.      Helane Rima, MD  Electronically Signed      Paula Compton, MD  Electronically Signed    EW/MEDQ  D:  01/14/2009  T:  01/14/2009  Job:  [id number]

## 2011-02-12 NOTE — Discharge Summary (Signed)
[name], [name]               ACCOUNT NO.:  [id number]   MEDICAL RECORD NO.:  [id number]          PATIENT TYPE:  INP   LOCATION:  5151                         FACILITY:  [facility]   PHYSICIAN:  Santiago Bumpers. Hensel, M.D.DATE OF BIRTH:  [birth year]   DATE OF ADMISSION:  01/14/2009  DATE OF DISCHARGE:  01/16/2009                               DISCHARGE SUMMARY   PRIMARY CARE PHYSICIAN:  At the [place].   DISCHARGE DIAGNOSES:  1. Presumed pulmonary embolism.  2. Status post L4-L5 decompression and fusion on January 06, 2009.  3. Depression and anxiety.  4. Hypertension.  5. Gastroesophageal reflux disease.  6. History of uterine fibroids.  7. Anemia.   DISCHARGE MEDICATIONS:  1. Lovenox 100 mg injected twice daily for 3-1/2 days to bridge for      Coumadin therapeutic INR.  2. Coumadin 10 mg by mouth on January 16, 2009, and then as directed by      physician.  3. Diazepam 5 mg every 6 hours as needed for anxiety.  4. Hydrochlorothiazide 12.5 mg daily.  5. Neurontin 300 mg 3 times daily.  6. Protonix 40 mg daily.  7. Percocet 1 tablet every 4 hours as needed for pain.  8. Effexor 150 mg daily.  9. The patient was instructed to stop taking her birth control pill.   PROCEDURES:  CT angio of the chest on January 14, 2009, impression,  extremely tiny filling defect of the bifurcation and subsegmental  pulmonary arteries to the posterior right lower lobe suspicious for a  tiny pulmonary embolus but cannot be diagnostic for such due to  respiratory motion in this region, which can cause artifact mimicking  this appearance.  No other embolic disease is identified within the  pulmonary artery.  A lower extremity Doppler was obtained in the  emergency department that showed no obvious deep vein thrombosis but did  show slow flow through blood vessels and environment conducive to such  deep vein thromboses.   LABORATORY DATA:  Upon discharge, white blood cell count 5.1, hemoglobin  9.4, hematocrit  27.5, platelets 289.  PT 13.5, INR 1.4  CMP within  normal limits. Anemia panel pending.   ASSESSMENT AND PLAN:  [name] is a very pleasant 45 year old  veteran female with suspected deep vein thrombosis/pulmonary embolism.  1. Presumed pulmonary embolism.  CT angio and lower extremity Dopplers      were suspicious for venous thromboembolus along with her calf pain      and pleuritic pain.  As we were unable to rule out PE on CT and      lower extremity Dopplers which showed no obvious DVT but an      environment conducive to formation.  Her risk factors also included      a family history of blood clots, recent surgery, and estrogen use.      She was started on anticoagulation therapy with Lovenox to bridge      to Coumadin.  She was able to acquire Lovenox and was discharged      home with this medication.  She had instructions  to follow up at      the [facility] the following day to have her      INR checked and to receive instructions on how much Coumadin is to      take at that point.  2. Status of back surgery and pain.  The patient's pain was controlled      with Vicodin prescribed by Neurosurgery.  They were aware that she      was in the hospital and did visit her during this time.  3. Depression, anxiety.  The patient's Effexor and Valium was      continued throughout her stay.  4. Hypertension.  The patient remained normotensive on her home dose      of hydrochlorothiazide.  5. Gastroesophageal reflux disease.  The patient continued on her      Protonix.  6. Anemia.  The patient has a history of anemia secondary to uterine      fibroids.  Her hemoglobin was 9.7 on the day of discharge.  She has      an anemia panel pending.  She was instructed to stop her birth      control pills as it contained estrogen.   FO[facility]UP[facility]  [facility] but [facility] to that, will have to follow  up at the [facility] with Dr. Earlene Plater.   She will  follow up for daily INR checks until she is therapeutic.   OTHER FOLLOWUP ISSUES:  Anemia panel.      Kelsey Rima, MD  Electronically Signed      Santiago Bumpers. Leveda Anna, M.D.  Electronically Signed    EW/MEDQ  D:  01/17/2009  T:  01/18/2009  Job:  [id number]

## 2011-02-12 NOTE — Op Note (Signed)
[name], [name]               ACCOUNT NO.:  [id number]   MEDICAL RECORD NO.:  [id number]          PATIENT TYPE:  INP   LOCATION:  3017                         FACILITY:  [facility]   PHYSICIAN:  Cristi Loron, M.D.DATE OF BIRTH:  April 14, 1966   DATE OF PROCEDURE:  DATE OF DISCHARGE:                               OPERATIVE REPORT   BRIEF HISTORY:  The patient is a 45 year old black female who has  suffered from intractable back and leg pain.  She failed medical  management, was worked up with a lumbar MRI as well as a lumbar  diskogram which demonstrated that she had disk degeneration at L4-5 and  5-1 with concordant pain at these levels.  I discussed the various  treatment with the patient including surgery.  The patient has weighed  the risks, benefits, and alternatives of procedure and decided to  proceed with an L4-5 and 5-1 posterior instrumentation, fusion, and  decompression.   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   PROCEDURE:  Bilateral L4 and L5 laminotomies and foraminotomies to  decompress the bilateral L4-5 to S1 nerve roots (this is in addition to  the work  required to do this exceeded the workup for the posterior  lumbar interbody fusion because of the stenosis); L4-5 and L5-S1  posterior lumbar interbody fusion with local morselized autograft bone  and Actifuse bone graft extender; insertion of L4-5 and L5-S1 interbody  prosthesis (Capstone PEEK interbody prosthesis); L4 to S1 posterior  segmental fixation with Legacy titanium pedicles and rods; L4-5 and L5-  S1 posterolateral arthrodesis with local morselized autograft bone and  Actifuse plus Vitoss bone graft extenders as well as local morselized  autograft bone.   SURGEON:  Cristi Loron, MD   ASSISTANT:  Danae Orleans.  Venetia Maxon, MD   ANESTHESIA:  General endotracheal.   ESTIMATED BLOOD LOSS:  300 mL.   SPECIMENS:  None.   DRAINS:  None.   COMPLICATIONS:  None.   DESCRIPTION OF PROCEDURE:  The patient was brought to the operating room  by anesthesia team.  General endotracheal anesthesia was induced.  The  patient was turned to the prone position on the Wilson frame.  The  lumbosacral region was then prepared with Betadine scrub and Betadine  solution.  Sterile drapes were applied.  I then injected the area to be  incised with Marcaine with epinephrine solution and used a scalpel to  make a linear midline incision over the L3-4, 4-5, and 5-1 interspaces.  I used electrocautery to perform a bilateral subperiosteal dissection  exposing the spinous process and lamina of L3, 4, 5, and the upper  sacrum.  We obtained intraoperative radiograph to confirm our location.   Because of the patient's foraminal stenosis, a wide decompression was  required.  We used a high-speed drill to perform bilateral L4 and L5  laminotomies and removed the medial aspect of the L4-5 and 5-1 facet  joints, and we performed wide foraminotomies about the  bilateral L4, L5,  and S1 nerve roots, decompressing these nerves bilaterally.   Having completed decompression, we now turned our attention to the  arthrodesis.  I incised the L4-5 and 5-1 intervertebral disk space  bilaterally with the #15 scalpel.  We performed a partial intervertebral  diskectomy using pituitary forceps and the curettes.  We then prepared  the vertebral endplates for the fusion using curettes and removing soft  tissues.  We used a trial spacer to determined to use 12- x 26-mm  Capstone PEEK interbody prosthesis bilaterally at L4-5 and 5-1.  We  prefilled these prosthesis with combination of local morselized  autograft bone.  We obtained our decompression as well as Actifuse bone  graft extender.  We inserted the prosthesis into the interspaces   bilaterally at 4-5 and 5-1 of course after retracting the neural  structures out of harm's way.  We got a good snug fit of prosthesis at  each level.  I should also mention we filled the remainder disk space in  with the local morselized autograft bone and Actifuse bone graft  extender.  This completed posterior lumbar interbody fusion.   We now turned our attention to the instrumentation.  Under fluoroscopic  guidance, we cannulated the bilateral L4-5 and S1 pedicles with the bone  probe.  We tapped the pedicles with a 6.5-mm tap and then probed inside  the tapped pedicles to rule out cortical breeches.  We then inserted 7.5-  x 50-mm pedicle screws bilaterally at L4 and L5 and 7.5 x 45 bilaterally  at S1.  We got a good bony purchase at each level.  We then palpated  along the medial aspect of the L4-5 and S1 pedicles and noted there was  no cortical breeches.  We then connected unilateral pedicle screws with  a lordotic rod.  We compressed the construct and then secured the rod in  place with the caps which was tightened appropriately.  This completed  the instrumentation.   We now turned attention to the posterolateral arthrodesis.  We used a  high-speed drill to corticate the remainder of the L5-S1 facet, the L4  and L5 pars, bilateral L4 and 5 transverse processes of the upper sacral  lamina, etc.  We then laid a combination of local morselized autograft  bone, Actifuse, and Vitoss bone graft extenders over these decorticated  posterolateral structures completing the posterolateral arthrodesis.   We then inspected the thecal sac in the body of L4-5 and S1 nerve roots.  Note, they were well decompressed.  We then obtained hemostasis using  bipolar electrocautery.  We irrigated the wound out with bacitracin  solution.  We then removed the retractor and then reapproximated the  patient's thoracolumbar fascia with interrupted #1 Vicryl suture, the  subcutaneous tissue with interrupted  2-0 Vicryl suture, and the skin  with Steri-Strips and Benzoin.  The wound was then coated with  bacitracin ointment.  A sterile dressing was applied.  The drapes were  removed, and the patient was subsequently returned to the supine  position where she was extubated by the anesthesia team and transported  to postanesthesia care unit .  All sponge, instrument, and needle counts  were correct at the end of this case.       Cristi Loron, M.D.  Electronically Signed     JDJ/MEDQ  D:  01/07/2009  T:  01/08/2009  Job:  [id number]

## 2011-02-12 NOTE — Consult Note (Signed)
[name], [name]               ACCOUNT NO.:  [id number]   MEDICAL RECORD NO.:  [id number]          PATIENT TYPE:  INP   LOCATION:  3017                         FACILITY:  [facility]   PHYSICIAN:  Lyn Records, M.D.   DATE OF BIRTH:  10-06-1965   DATE OF CONSULTATION:  01/06/2009  DATE OF DISCHARGE:  12/22/2008                                 CONSULTATION   REASON FOR CONSULTATION:  Tachycardia.   CONCLUSIONS:  1. Sinus tachycardia secondary to either pain, fever, some other      physiologic perturbation.  This is not a pathologic arrhythmia.  2. Hypertension.  3. History of depression.  4. Gastroesophageal reflux disease.  5. History of uterine fibroids.   RECOMMENDATIONS:  1. I would check cardiac markers including a troponin I and BNP.  2. If needed, we could start low-dose beta-blocker therapy to slow the      heart rate.  3. We need to make sure that the patient is not developing volume      depletion, that she has good pain control, that she is not hypoxic,      and that her thyroid function is normal.  4. No specific other cardiac evaluation is felt necessary at this time      unless the above database reveals abnormality.  If the BNP is high,      I would suggest performing a 2-D echocardiogram to rule out occult      severe left ventricular dysfunction.   COMMENTS:  The patient was admitted by Dr. Lovell Sheehan on January 05, 2009, for  lumbar surgery.  This was successfully completed, and at around 9:00  a.m. today, she developed heart rates in the 130s and possibly as high  as the 200s although there is no documentation of a rate this fast on  the chart.  The patient is very lethargic now from pain medications.  She states that there has never been a heart problem.  She denies  palpitations.  She had no symptoms or complaints at that time that the  heart rate increase was noted.  There has been no chills or fever.  She  has no history of blood loss.  She is on no medications  that lower her  blood pressure at this time.   CURRENT MEDICATION REGIMEN:  1. Colace.  2. Effexor.  3. Hydrochlorothiazide 12.5 mg per day.  4. Neurontin 300 mg t.i.d.  5. Morphine pump.  6. Plaquenil 200 mg b.i.d.  7. Protonix 40 mg per day.  8. Ambien 10 mg nightly.  9. Benadryl 25 mg q.6 h.  10.Claritin 10 mg per day.  11.Dilaudid 4 mg tablets q.4 h. p.r.n. pain.  12.Senokot-S tablets.  13.Valium 5 mg q.6 h. P.r.n.  14.Zofran 2 mg IV doses as needed for nausea.   ALLERGIES:  OXYCODONE.   HABITS:  Does not smoke or drink.   FAMILY HISTORY:  Father had diabetes and has coronary artery disease.   PHYSICAL EXAMINATION:  GENERAL:  The patient is lethargic.  She can  barely stay awake during examination and questioning.  VITAL  SIGNS:  Her blood pressure is 142/97, heart rate when initially  evaluated by our nurse practitioner was 134 and is currently 80.  Her  temperature initially was 100.4.  CARDIAC:  No murmur or rub.  An S4 gallop is heard.  LUNGS:  Clear.  EXTREMITIES:  No edema.   The EKG performed this morning at 6:14 a.m. demonstrates sinus  tachycardia with nonspecific T-wave abnormality.  EKG performed on January 04, 2009, revealed sinus rhythm at 99 beats per minute with no acute  change.  EKG done on January 03, 2009, reveals normal sinus rhythm with  nonspecific ST abnormality.   LABORATORY DATA:  Creatinine of 0.95, BUN of 9.  Hemoglobin of 10.3.  Chest x-ray reveals no acute disease.   DISCUSSION:  The patient had sinus tachycardia.  This is a physiologic  response to something going on at the time of the increased heart rate.  I wonder about the possibility of oversedation with hypoxia,  hyperthyroidism, bleeding, hypovolemia, fever, and other potential  causes for the physiologic increase in heart rate.  It would be  reasonable to check cardiac markers, to place the patient on telemetry,  and to check TSH.  A BNP would also be helpful to rule out occult LV   dysfunction.  We will follow and do further workup if indicated based on  the initial relatively simple database.      Lyn Records, M.D.  Electronically Signed     Lyn Records, M.D.  Electronically Signed    HWS/MEDQ  D:  01/06/2009  T:  01/08/2009  Job:  [id number]   cc:   Idalia Needle, MD  Cristi Loron, M.D.

## 2011-02-15 NOTE — Discharge Summary (Signed)
NAMEMESHA, SCHAMBERGER               ACCOUNT NO.:  [id number]   MEDICAL RECORD NO.:  [id number]          PATIENT TYPE:  INP   LOCATION:  3017                         FACILITY:  [facility]   PHYSICIAN:  Cristi Loron, M.D.DATE OF BIRTH:  Dec 29, 1965   DATE OF ADMISSION:  01/05/2009  DATE OF DISCHARGE:  01/09/2009                               DISCHARGE SUMMARY   BRIEF HISTORY:  The patient is a 45 year old black female who has  suffered from intractable back and leg pain.  She has failed medical  management and worked up with a lumbar MRI as well as diskogram, which  demonstrated that she had disk degeneration at L4-5 and L5-S1 with  concordant pain at these levels.  I discussed the various treatment  options with the patient including surgery.  The patient has weighed the  risks, benefits, and alternatives of surgery and decided to proceed with  an L4-5 and L5-S1 decompression, instrumentation, and fusion.   For further details of this admission, please refer to typed history and  physical.   HOSPITAL COURSE:  I admitted the patient to [facility] on January 05, 2009.  On the day of admission, I performed L4-5, L5-S1  decompression, instrumentation, and fusion.  The surgery went well (for  the full details of this operation, please refer to typed operative  note).   POSTOPERATIVE COURSE:  The patient's postoperative course was  unremarkable.  She was discharged to home on postop day #4, i.e., January 09, 2009.   DISCHARGE INSTRUCTIONS:  The patient was given written discharge  instructions.  Instructed to follow up with me in 4 weeks.   DISCHARGE PRESCRIPTIONS:  1. Percocet 10/325 #100 one p.o. q.4 h. for pain.  2. Valium 5 mg #50 one p.o. q.6 h. for muscle spasms.   FINAL DIAGNOSES:  L4-5, L5-S1 disk degeneration, stenosis, lumbago,  lumbar radiculopathy.   PROCEDURE PERFORMED:  Bilateral L4 and L5 laminotomies and  foraminotomies to decompress the bilateral L4, L5, and  S1 nerve roots  (instrumentation was required to do the posterior lumbar interbody  fusion because of the stenosis); L4-5, L5-S1 posterior lumbar interbody  fusion with local morselized autograft bone and Actifuse/Vitoss bone  graft extender; insertion of L4-5, L5-S1 interbody prosthesis (Capstone  PEEK interbody prosthesis); L4-S1  posterior segmental instrumentation with Legacy titanium pedicle screws  and rods; L4-5, L5-S1 posterolateral arthrodesis with local morselized  autograft bone and Actifuse/Vitoss bone graft extenders.  as well as local morselized autograft bone, obtained during the  decompression.      Cristi Loron, M.D.  Electronically Signed     Cristi Loron, M.D.  Electronically Signed    JDJ/MEDQ  D:  01/26/2009  T:  01/26/2009  Job:  [id number]

## 2011-02-27 NOTE — Op Note (Signed)
[name], [name]               ACCOUNT NO.:  [id number]  MEDICAL RECORD NO.:  [id number]           PATIENT TYPE:  O  LOCATION:  XRAY                         FACILITY:  [facility]  PHYSICIAN:  Genia Del, M.D.DATE OF BIRTH:  1966/08/26  DATE OF PROCEDURE: DATE OF DISCHARGE:  01/25/2011                              OPERATIVE REPORT   PREOPERATIVE DIAGNOSES:  Pelvic pain with uterine myomas, menorrhagia, status post embolization of uterine artery, history of pulmonary embolism.  POSTOPERATIVE DIAGNOSES:  Pelvic pain with uterine myomas, menorrhagia, status post embolization of uterine artery, history of pulmonary embolism, severe bowel adhesions with anterior wall uterus and myoma.  PROCEDURE:  Total laparoscopic hysterectomy with extensive lysis of adhesions assisted with Engineer, building services robot.  SURGEON:  Genia Del, M.D.  ASSISTANT:  Maxie Better, M.D.  DESCRIPTION OF PROCEDURE:  Under general anesthesia with endotracheal intubation, the patient was placed in lithotomy position.  She was prepped with Betadine on the abdominal, suprapubic, vulvar, and vaginal areas and draped as usual.  The vaginal exam revealed an anteverted uterus with nodular contour, no adnexal masses were felt.  The weighted speculum was inserted into the vagina.  The anterior lip of the cervix was grasped with a tenaculum.  The hysterometry was at 8 cm.  The cervix was dilated with Hegar dilators up to #27 without difficulty.  A #8 RUMI was used with a KOH ring.  This was put in place without any difficulty. We then removed the tenaculum and weighted speculum.  A Foley was put in place in the bladder.  We then returned to the abdomen.  We infiltrated the subcutaneous tissue at the palmar's point with Marcaine 1.5% plain. We made a 5 cm incision at that level and inserted the Veress needle. Security tests were done.  A pneumoperitoneum was created with CO2.  The Veress needle was then removed  and a 5 mm trocar just inserted with a 5 mm camera at that level.  No adhesions were present in the abdomen, but in the pelvis, there were extensive adhesions between the bowel, the anterior wall, and this was all covering a large fundal myoma in the uterus.  The pelvis was completely obliterated with bowel adhesions.  We used __________ configuration for port placement.  We infiltrated each site with Marcaine 1.5 % plain.  We made two 8 mm incisions on the right side and one 8 mm incision on the left, and the robotic camera will be at the supraumbilical area.  We entered all trocars under direct vision. We then docked the robots on the right side and put the instruments in place.  We used the EndoShears scissor in the first arm, the PK in the second arm, and the Cobra in the third arm.  We __________.  We pulled the robotic camera and dropped in 4 sutures of Vicryl 0 at 6 inches each and those were parked on the peritoneum on the right abdominal wall.  We then started the lysis of adhesions.  We started at the junction between the myoma and the anterior wall.  We then tried to get a stay posteriorly to  the right and then the left.  We finally were able to release all the adhesions between the fibroid and the posterior aspect of the uterus and the bowels.  That uncovers the pelvic anatomy completely and allows [place] to stop the hysterectomy.  We located the ureters on each side and they were in normal anatomic position.  We started on the right side.  We cauterized and sectioned the right utero- ovarian ligaments.  We cauterized and sectioned the right tube and then we accessed the right round ligaments and cauterized and sectioned it. We proceeded in the same way on the left side.  We then descent on the lateral left side and then lateral right side to reach just before the uterine arteries.  We were able to open the visceral peritoneum anteriorly almost in the midlines and started reclining  the bladder.  We then decided to proceed with a myomectomy given that the largest myoma was a wide-based pedunculated myoma at the fundus and this was what really was preventing [place] from manipulating the uterus, given that this myoma was still attached to the anterior wall of the abdominopelvic cavity.  This myoma also was evidently partially necrosed by the previous embolization of the uterine artery.  It was still very large and quite a bit blocking the view measuring probably about 10 cm in diameter.  We were easily able to cauterize and section the wide peduncle and completely free the uterus from that myoma.  The myoma space was attached to the anterior wall during the rest of the hysterotomy.  Now with this tremendous improvement in vision, we were able to finish opening the visceral peritoneum anteriorly and we were able to recline the bladder down and passed the superior aspect of the KOH ring.  We can then safely cauterize and section the left uterine artery and the right uterine artery.  We then waited before proceeding to colpotomy given that the fibroid was still attached to the wall.  We released the fibroid by fine dissection still with the EndoShear scissorand the PK.  Once the fibroid was completely freed, we proceeded with a colpotomy with the tip of the EndoShears scissor.  We started anteriorly then posteriorly, and finished on each side.  The uterus was completely freed with the cervix and removed vaginally.  We were then able to bring the fibroid to the vagina and given the soft texture of this fibroid, it successfully passed vaginally.  Both specimens were sent to Pathology. We then switched instruments to the cutting needle driver in the first arm, the regular needle driver in the second, and the PK in the third arm.  We proceeded with closure of the vaginal vault using the sutures that were parked on the right abdominal peritoneum.  We closed the right angle with a  figure-of-8 then the left angle and finished with 2 figure- of-8 in the middle that closes the vaginal vault completely.  We were careful to take good bites each time including the vaginal mucosa.  We then irrigated and suctioned, made sure that hemostasis was adequate at all levels.  We removed all robotic instruments, undocked the robot.  We went by laparoscopy with a 5 mm camera to remove the 4 sutures that were parked in the right abdominal peritoneum.  We then irrigated and suctioned once more.  We confirmed good hemostasis.  We removed all laparoscopic instruments, removed all trocars, and evacuated the CO2. The count of instruments and sponges was complete.  We  then closed all incisions.  The supraumbilical incision was closed at the aponeurosis with a running Vicryl 0.  We then closed the skin on all incisions with Vicryl 4-0 subcuticular stitch and added Dermabond on all incisions. The occluder was removed from the vagina.  Hemostasis was adequate at that level as well.  The patient received Ancef 1 g IV before induction. She was given heparin subcu prophylaxis given her history of pulmonary embolism postop of a back surgery.  We will continue with Lovenox 40 mg subcu daily.     Genia Del, M.D.     ML/MEDQ  D:  02/01/2011  T:  02/02/2011  Job:  [id number]  Electronically Signed by Genia Del M.D. on 02/27/2011 05:03:59 PM

## 2011-04-09 NOTE — Discharge Summary (Signed)
NAMEBRICEYDA, [name]               ACCOUNT NO.:  [id number]  MEDICAL RECORD NO.:  [id number]  LOCATION:                               FACILITY:  [id number]  PHYSICIAN:  Genia Del, M.D.DATE OF BIRTH:  [birth year]  DATE OF ADMISSION:  02/01/2011 DATE OF DISCHARGE:  02/02/2011                              DISCHARGE SUMMARY   PREOPERATIVE DIAGNOSES:  Pelvic pain with uterine myomas, menorrhagia, status post embolization of uterine artery.  POSTOPERATIVE DIAGNOSES:  Pelvic pain with uterine myomas, menorrhagia, status post embolization of uterine artery with severe bowel adhesions with anterior wall of the abdomen with uterus and with myoma.  PROCEDURES:  Total laparoscopy hysterectomy with extensive lysis of adhesions assisted with da Vinci robot.  SURGEON:  Genia Del, MD  ASSISTANT:  Maxie Better, MD  HOSPITAL COURSE:  The patient had an uneventful surgery with extensive lysis of adhesions and total laparoscopy hysterectomy assisted with the da Vinci robot.  She had no complication.  The estimated blood loss was 100 mL.  Her postop evolution was unremarkable.  She remained hemodynamically stable and afebrile.  She was discharged on postop day #1.  She was covered for thrombosis during surgery and in the postop time given history of pulmonary embolism.  Lovenox 40 mg subcu was prescribed for 4 weeks postop.  She was given postop recommendations and will follow up with me in 3 weeks.     Genia Del, M.D.     ML/MEDQ  D:  04/05/2011  T:  04/06/2011  Job:  [id number]

## 2011-04-29 ENCOUNTER — Other Ambulatory Visit: Payer: Self-pay | Admitting: Neurosurgery

## 2011-04-29 DIAGNOSIS — M545 Low back pain: Secondary | ICD-10-CM

## 2011-05-03 ENCOUNTER — Ambulatory Visit
Admission: RE | Admit: 2011-05-03 | Discharge: 2011-05-03 | Disposition: A | Discharge status: Home / Self Care | Payer: Medicare Other | Source: Ambulatory Visit | Attending: Neurosurgery | Admitting: Neurosurgery

## 2011-05-03 DIAGNOSIS — M545 Low back pain, unspecified: Secondary | ICD-10-CM

## 2011-05-03 MED ORDER — GADOBENATE DIMEGLUMINE 529 MG/ML IV SOLN
19.0000 mL | Freq: Once | INTRAVENOUS | Status: AC | PRN
  Administered 2011-05-03: 19 mL via INTRAVENOUS

## 2011-06-19 LAB — POCT CARDIAC MARKERS
Myoglobin, poc: 26.2
Operator id: 282201
Operator id: 282201

## 2011-06-19 LAB — CBC
HCT: 35.5 — ABNORMAL LOW
Hemoglobin: 11.9 — ABNORMAL LOW
MCV: 87.4
Platelets: 242
RBC: 4.05
WBC: 4.7

## 2011-06-19 LAB — I-STAT 8, (EC8 V) (CONVERTED LAB)
Chloride: 109
HCT: 40
Potassium: 4.6
Sodium: 139
TCO2: 30
pH, Ven: 7.27

## 2011-06-19 LAB — POCT I-STAT CREATININE
Creatinine, Ser: 0.9
Operator id: 282201

## 2011-06-19 LAB — DIFFERENTIAL
Eosinophils Absolute: 0.1
Eosinophils Relative: 2
Lymphs Abs: 1.7
Monocytes Absolute: 0.4
Monocytes Relative: 7

## 2011-06-27 LAB — DIFFERENTIAL
Basophils Relative: 1
Lymphocytes Relative: 32
Lymphs Abs: 1.2
Monocytes Relative: 7
Neutro Abs: 2.2
Neutrophils Relative %: 58

## 2011-06-27 LAB — URINALYSIS, ROUTINE W REFLEX MICROSCOPIC
Bilirubin Urine: NEGATIVE
Glucose, UA: NEGATIVE
Ketones, ur: NEGATIVE
Nitrite: NEGATIVE
Protein, ur: NEGATIVE
Specific Gravity, Urine: 1.004 — ABNORMAL LOW
Specific Gravity, Urine: >1.046 — ABNORMAL HIGH
Urobilinogen, UA: 0.2

## 2011-06-27 LAB — COMPREHENSIVE METABOLIC PANEL
Albumin: 3.3 — ABNORMAL LOW
Alkaline Phosphatase: 44
BUN: 12
Calcium: 8.8
Creatinine, Ser: 0.83
Glucose, Bld: 111 — ABNORMAL HIGH
Total Protein: 6.8

## 2011-06-27 LAB — CBC
HCT: 34.6 — ABNORMAL LOW
Hemoglobin: 11.6 — ABNORMAL LOW
MCHC: 33.7
MCV: 87.3
Platelets: 203
RDW: 12.4

## 2011-06-27 LAB — PREGNANCY, URINE: Preg Test, Ur: NEGATIVE

## 2011-09-23 ENCOUNTER — Emergency Department (HOSPITAL_COMMUNITY): Payer: Medicare Other

## 2011-09-23 ENCOUNTER — Other Ambulatory Visit: Payer: Self-pay

## 2011-09-23 ENCOUNTER — Emergency Department (HOSPITAL_COMMUNITY)
Admission: EM | Admit: 2011-09-23 | Discharge: 2011-09-23 | Disposition: A | Discharge status: Home / Self Care | Payer: Medicare Other | Attending: Emergency Medicine | Admitting: Emergency Medicine

## 2011-09-23 ENCOUNTER — Encounter: Payer: Self-pay | Admitting: Family Medicine

## 2011-09-23 DIAGNOSIS — R0989 Other specified symptoms and signs involving the circulatory and respiratory systems: Secondary | ICD-10-CM | POA: Insufficient documentation

## 2011-09-23 DIAGNOSIS — R42 Dizziness and giddiness: Secondary | ICD-10-CM | POA: Insufficient documentation

## 2011-09-23 DIAGNOSIS — R0602 Shortness of breath: Secondary | ICD-10-CM | POA: Insufficient documentation

## 2011-09-23 DIAGNOSIS — Z86718 Personal history of other venous thrombosis and embolism: Secondary | ICD-10-CM | POA: Insufficient documentation

## 2011-09-23 DIAGNOSIS — R079 Chest pain, unspecified: Secondary | ICD-10-CM | POA: Insufficient documentation

## 2011-09-23 DIAGNOSIS — I519 Heart disease, unspecified: Secondary | ICD-10-CM | POA: Insufficient documentation

## 2011-09-23 DIAGNOSIS — I10 Essential (primary) hypertension: Secondary | ICD-10-CM | POA: Insufficient documentation

## 2011-09-23 DIAGNOSIS — R0609 Other forms of dyspnea: Secondary | ICD-10-CM | POA: Insufficient documentation

## 2011-09-23 HISTORY — DX: Heart disease, unspecified: I51.9

## 2011-09-23 HISTORY — DX: Acute embolism and thrombosis of unspecified deep veins of unspecified lower extremity: I82.409

## 2011-09-23 HISTORY — DX: Essential (primary) hypertension: I10

## 2011-09-23 LAB — COMPREHENSIVE METABOLIC PANEL
BUN: 15 mg/dL (ref 6–23)
CO2: 29 mEq/L (ref 19–32)
Chloride: 98 mEq/L (ref 96–112)
Creatinine, Ser: 0.89 mg/dL (ref 0.50–1.10)
GFR calc non Af Amer: 78 mL/min — ABNORMAL LOW (ref >90)
Glucose, Bld: 87 mg/dL (ref 70–99)
Total Bilirubin: 0.3 mg/dL (ref 0.3–1.2)

## 2011-09-23 LAB — CBC
MCH: 29.5 pg (ref 26.0–34.0)
MCHC: 33.5 g/dL (ref 30.0–36.0)
Platelets: 249 10*3/uL (ref 150–400)
RBC: 4.1 MIL/uL (ref 3.87–5.11)

## 2011-09-23 LAB — CARDIAC PANEL(CRET KIN+CKTOT+MB+TROPI): Total CK: 108 U/L (ref 7–177)

## 2011-09-23 LAB — D-DIMER, QUANTITATIVE: D-Dimer, Quant: 1.43 ug/mL-FEU — ABNORMAL HIGH (ref 0.00–0.48)

## 2011-09-23 MED ORDER — IBUPROFEN 800 MG PO TABS: 800.0000 mg | ORAL_TABLET | Freq: Three times a day (TID) | ORAL | Status: AC

## 2011-09-23 MED ORDER — IOHEXOL 300 MG/ML  SOLN
100.0000 mL | Freq: Once | INTRAMUSCULAR | Status: AC | PRN
  Administered 2011-09-23: 100 mL via INTRAVENOUS

## 2011-09-23 MED ORDER — KETOROLAC TROMETHAMINE 30 MG/ML IJ SOLN
30.0000 mg | Freq: Once | INTRAMUSCULAR | Status: AC
  Administered 2011-09-23: 30 mg via INTRAVENOUS
  Filled 2011-09-23: qty 1

## 2011-09-23 MED ORDER — SODIUM CHLORIDE 0.9 % IV SOLN
999.0000 mL | Freq: Once | INTRAVENOUS | Status: AC
  Administered 2011-09-23: 1000 mL via INTRAVENOUS

## 2011-09-23 NOTE — ED Notes (Signed)
MD at bedside. 

## 2011-09-23 NOTE — ED Notes (Signed)
sts that her pain is less now only 4/10

## 2011-09-23 NOTE — ED Notes (Signed)
Pt reports having severe right-sided chest pain described as heavy starting on 09/21/11 with sob, dizziness, and nausea. States she did not have any pain on 12/23 and pain started up again intermittently and not as severe today. NAD noted at this time.

## 2011-09-23 NOTE — ED Notes (Signed)
Pt arrived to ED after "watching Dr. Neil Crouch interview [name] on TV this afternoon about having a heart attach. I realized I had all 5 of the signs of a heart attach.". Pt states she has R sided CP. Denies any other s/s, no SOB, nausea. States she felt dizzy and light headed for the last two years, and that she decieded she should be checked out. MD at bedside.

## 2011-09-23 NOTE — ED Provider Notes (Signed)
History     CSN: [id number]  Arrival date & time 09/23/11  1807   First MD Initiated Contact with Patient 09/23/11 1834      Chief Complaint  Patient presents with  . Chest Pain    (Consider location/radiation/quality/duration/timing/severity/associated sxs/prior treatment) HPI The patient presents with right-sided chest pain. She notes that over the past 2 years she has had shortness of breath, which has not changed. She notes that her chest pain began 2 days ago, insidiously, since onset has been present constantly. The pain is focally about the right superior lateral chest. It is nonradiating, sore, worse on per day, nonpleuritic, nonexertional. She also claims a mild lightheadedness, though it seems as though this has been present since her shortness of breath began 2 years ago. Notably the patient has a history of PE, that occurred during the postoperative period; she is not on anticoagulants. No clear alleviating or exacerbating factors Past Medical History  Diagnosis Date  . DVT (deep venous thrombosis)   . Hypertension   . Heart disease     Past Surgical History  Procedure Date  . Abdominal hysterectomy   . Back surgery     History reviewed. No pertinent family history.  History  Substance Use Topics  . Smoking status: Never Smoker   . Smokeless tobacco: Not on file  . Alcohol Use: No    OB History    Grav Para Term Preterm Abortions TAB SAB Ect Mult Living                  Review of Systems  Constitutional:       HPI  HENT:       HPI otherwise negative  Eyes: Negative.   Respiratory:       HPI, otherwise negative  Cardiovascular:       HPI, otherwise nmegative  Gastrointestinal: Negative for vomiting.  Genitourinary:       HPI, otherwise negative  Musculoskeletal:       HPI, otherwise negative  Skin: Negative.   Neurological: Negative for syncope.    Allergies  Review of patient's allergies indicates no known allergies.  Home Medications    No current outpatient prescriptions on file.  BP 124/84  Pulse 68  Temp(Src) 99 F (37.2 C) (Oral)  Resp 16  SpO2 98%  Physical Exam  Nursing note and vitals reviewed. Constitutional: She is oriented to person, place, and time. She appears well-developed and well-nourished. No distress.  HENT:  Head: Normocephalic and atraumatic.  Eyes: Conjunctivae and EOM are normal.  Cardiovascular: Normal rate and regular rhythm.   Pulmonary/Chest: Effort normal and breath sounds normal. No stridor. No respiratory distress.  Abdominal: She exhibits no distension.  Musculoskeletal: She exhibits no edema.  Neurological: She is alert and oriented to person, place, and time. No cranial nerve deficit.  Skin: Skin is warm and dry.  Psychiatric: She has a normal mood and affect.    ED Course  Procedures (including critical care time)   Labs Reviewed  CBC  CARDIAC PANEL(CRET KIN+CKTOT+MB+TROPI)  MAGNESIUM  COMPREHENSIVE METABOLIC PANEL  D-DIMER, QUANTITATIVE   No results found.   No diagnosis found.  Pulse ox 99% room air normal Cardiac monitor 81 sinus rhythm normal  Date: 09/23/2011  Rate: 79  Rhythm: normal sinus rhythm  QRS Axis: normal  Intervals: normal  ST/T Wave abnormalities: normal  Conduction Disutrbances:none  Narrative Interpretation:   Old EKG Reviewed: unchanged  Normal  MDM  This 45 year old female with notable  history of prior PE now presents with uterus of lightheadedness, dyspnea and new chest pain. On exam the patient is in no distress with unremarkable vital signs. Patient's ECG and chest x-ray are reassuring. Similarly reassuring is the CT of her chest does not demonstrate PE. The patient noted resolution of her symptoms with Toradol. Given the patient's presentation for chest pain following watching an episode of Dr. Neil Crouch in which he described symptoms concerning for heart attack, and the aforementioned unremarkable evaluation, she is appropriate for discharge  with continued evaluation by her primary care physician for what seems to be musculoskeletal pain        Gerhard Munch, MD 09/23/11 2300

## 2011-09-23 NOTE — ED Notes (Signed)
Pt states she is worried that something about her medically is being overlooked. She states that her mother has a cardiac hx with stents and surgery, and she is afraid the same thing is happening to her.

## 2011-12-08 ENCOUNTER — Other Ambulatory Visit: Payer: Self-pay

## 2011-12-08 ENCOUNTER — Emergency Department (HOSPITAL_COMMUNITY): Payer: Medicare Other

## 2011-12-08 ENCOUNTER — Emergency Department (HOSPITAL_COMMUNITY)
Admission: EM | Admit: 2011-12-08 | Discharge: 2011-12-08 | Disposition: A | Discharge status: Home / Self Care | Payer: Medicare Other | Attending: Emergency Medicine | Admitting: Emergency Medicine

## 2011-12-08 ENCOUNTER — Encounter (HOSPITAL_COMMUNITY): Payer: Self-pay | Admitting: *Deleted

## 2011-12-08 DIAGNOSIS — Z79899 Other long term (current) drug therapy: Secondary | ICD-10-CM | POA: Insufficient documentation

## 2011-12-08 DIAGNOSIS — R609 Edema, unspecified: Secondary | ICD-10-CM | POA: Insufficient documentation

## 2011-12-08 DIAGNOSIS — R079 Chest pain, unspecified: Secondary | ICD-10-CM | POA: Insufficient documentation

## 2011-12-08 DIAGNOSIS — R0789 Other chest pain: Secondary | ICD-10-CM | POA: Insufficient documentation

## 2011-12-08 DIAGNOSIS — I10 Essential (primary) hypertension: Secondary | ICD-10-CM | POA: Insufficient documentation

## 2011-12-08 DIAGNOSIS — Z86718 Personal history of other venous thrombosis and embolism: Secondary | ICD-10-CM | POA: Insufficient documentation

## 2011-12-08 DIAGNOSIS — R42 Dizziness and giddiness: Secondary | ICD-10-CM | POA: Insufficient documentation

## 2011-12-08 DIAGNOSIS — M549 Dorsalgia, unspecified: Secondary | ICD-10-CM | POA: Insufficient documentation

## 2011-12-08 HISTORY — DX: Other intervertebral disc displacement, lumbar region: M51.26

## 2011-12-08 HISTORY — DX: Other pulmonary embolism without acute cor pulmonale: I26.99

## 2011-12-08 LAB — CBC
Hemoglobin: 10.2 g/dL — ABNORMAL LOW (ref 12.0–15.0)
MCH: 30.1 pg (ref 26.0–34.0)
Platelets: 186 10*3/uL (ref 150–400)
RBC: 3.39 MIL/uL — ABNORMAL LOW (ref 3.87–5.11)
WBC: 4 10*3/uL (ref 4.0–10.5)

## 2011-12-08 LAB — POCT I-STAT, CHEM 8
BUN: 8 mg/dL (ref 6–23)
Chloride: 105 mEq/L (ref 96–112)
Creatinine, Ser: 0.7 mg/dL (ref 0.50–1.10)
Potassium: 3.6 mEq/L (ref 3.5–5.1)
Sodium: 142 mEq/L (ref 135–145)
TCO2: 26 mmol/L (ref 0–100)

## 2011-12-08 LAB — POCT I-STAT TROPONIN I: Troponin i, poc: 0 ng/mL (ref 0.00–0.08)

## 2011-12-08 MED ORDER — KETOROLAC TROMETHAMINE 30 MG/ML IJ SOLN
30.0000 mg | Freq: Once | INTRAMUSCULAR | Status: AC
  Administered 2011-12-08: 30 mg via INTRAVENOUS
  Filled 2011-12-08: qty 1

## 2011-12-08 MED ORDER — HYDROCODONE-ACETAMINOPHEN 5-325 MG PO TABS
1.0000 | ORAL_TABLET | Freq: Once | ORAL | Status: AC
Start: 2011-12-08 — End: 2011-12-08
  Administered 2011-12-08: 1 via ORAL
  Filled 2011-12-08: qty 1

## 2011-12-08 MED ORDER — IOHEXOL 300 MG/ML  SOLN
100.0000 mL | Freq: Once | INTRAMUSCULAR | Status: AC | PRN
  Administered 2011-12-08: 100 mL via INTRAVENOUS

## 2011-12-08 NOTE — ED Provider Notes (Addendum)
History     CSN: [id number]  Arrival date & time 12/08/11  1810   First MD Initiated Contact with Patient 12/08/11 1917      Chief Complaint  Patient presents with  . Chest Pain    right  . Dizziness  . Back Pain    right, upper  Pain began yesterday, upper back R side.  Constant. Worse with certain movements.  Then pain began to hurt more in  R ant chest wall area. Minimal lightheadedness.  No sob.  Some dry cough.  No fever.  Using cough medicine.  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  No longer on coumadin.  Did have 2nd PE after that. No pleuritic pain. BAck pain still hurts with movement.  No pain or swelling in legs. Not exactly similar to previous PE.   (Consider location/radiation/quality/duration/timing/severity/associated sxs/prior treatment) HPI  Past Medical History  Diagnosis Date  . DVT (deep venous thrombosis)   . Hypertension   . Heart disease   . Pulmonary embolism   . Ruptured lumbar disc     Past Surgical History  Procedure Date  . Abdominal hysterectomy   . Back surgery   . Lumbar fusion   . Uterine fibroids removed     History reviewed. No pertinent family history.  History  Substance Use Topics  . Smoking status: Never Smoker   . Smokeless tobacco: Never Used  . Alcohol Use: No    OB History    Grav Para Term Preterm Abortions TAB SAB Ect Mult Living                  Review of Systems  All other systems reviewed and are negative.    Allergies  Oxycontin  Home Medications   Current Outpatient Rx  Name Route Sig Dispense Refill  . ASPIRIN EC 81 MG PO TBEC Oral Take 81 mg by mouth daily.      . CHLORPHENIRAMINE-ACETAMINOPHEN 2-325 MG PO TABS Oral Take 2 capsules by mouth once.    Marland Kitchen DIAZEPAM 5 MG PO TABS Oral Take 5 mg by mouth 3 (three) times daily.     Marland Kitchen DILTIAZEM HCL ER 180 MG PO CP24 Oral Take 180 mg by mouth daily.      Marland Kitchen FOLIC ACID 1 MG PO TABS Oral Take 1 mg by mouth daily.      Marland Kitchen HYDROCHLOROTHIAZIDE 25 MG PO  TABS Oral Take 25 mg by mouth daily.      Marland Kitchen HYDROCODONE-ACETAMINOPHEN 5-500 MG PO TABS Oral Take 2 tablets by mouth every 6 (six) hours as needed. pain    . METHOTREXATE (ANTI-RHEUMATIC) 2.5 MG PO TABS Oral Take 12.5 mg by mouth once a week.    . VENLAFAXINE HCL ER 225 MG PO TB24 Oral Take 1 tablet by mouth daily.      Marland Kitchen VITAMIN C 500 MG PO TABS Oral Take 500 mg by mouth daily.      Marland Kitchen VITAMIN D (ERGOCALCIFEROL) 50000 UNITS PO CAPS Oral Take 50,000 Units by mouth every 7 (seven) days.      BP 127/85  Pulse 81  Temp(Src) 98.8 F (37.1 C) (Oral)  Resp 18  Ht 5\' 11"  (1.803 m)  Wt 213 lb (96.616 kg)  BMI 29.71 kg/m2  SpO2 98%  Physical Exam  Nursing note and vitals reviewed. Constitutional: She is oriented to person, place, and time. She appears well-developed and well-nourished.  HENT:  Head: Normocephalic and atraumatic.  Eyes: Conjunctivae and EOM  are normal. Pupils are equal, round, and reactive to light.  Neck: Neck supple.  Cardiovascular: Normal rate and regular rhythm.  Exam reveals no gallop and no friction rub.   No murmur heard. Pulmonary/Chest: Breath sounds normal. She has no wheezes. She has no rales. She exhibits no tenderness.  Abdominal: Soft. Bowel sounds are normal. She exhibits no distension. There is no tenderness. There is no rebound and no guarding.  Musculoskeletal: Normal range of motion. She exhibits edema and tenderness.       Diffuse tenderness L upper thoracic spine  Neurological: She is alert and oriented to person, place, and time. No cranial nerve deficit. Coordination normal.  Skin: Skin is warm and dry. No rash noted.  Psychiatric: She has a normal mood and affect.    ED Course  Procedures (including critical care time)  Labs Reviewed - No data to display No results found.   No diagnosis found.    MDM  Pt is seen and examined;  Initial history and physical completed.  Will follow.    Date: 12/08/2011  Rate: 81  Rhythm: normal sinus  rhythm  QRS Axis: normal  Intervals: normal  ST/T Wave abnormalities: nonspecific ST/T changes  Conduction Disutrbances:none  Narrative Interpretation:   Old EKG Reviewed: changes noted   Results for orders placed during the hospital encounter of 12/08/11  CBC      Component Value Range   WBC 4.0  4.0 - 10.5 (K/uL)   RBC 3.39 (*) 3.87 - 5.11 (MIL/uL)   Hemoglobin 10.2 (*) 12.0 - 15.0 (g/dL)   HCT 16.1 (*) 09.6 - 46.0 (%)   MCV 88.8  78.0 - 100.0 (fL)   MCH 30.1  26.0 - 34.0 (pg)   MCHC 33.9  30.0 - 36.0 (g/dL)   RDW 04.5  40.9 - 81.1 (%)   Platelets 186  150 - 400 (K/uL)  POCT I-STAT, CHEM 8      Component Value Range   Sodium 142  135 - 145 (mEq/L)   Potassium 3.6  3.5 - 5.1 (mEq/L)   Chloride 105  96 - 112 (mEq/L)   BUN 8  6 - 23 (mg/dL)   Creatinine, Ser 9.14  0.50 - 1.10 (mg/dL)   Glucose, Bld 782 (*) 70 - 99 (mg/dL)   Calcium, Ion 9.56  2.13 - 1.32 (mmol/L)   TCO2 26  0 - 100 (mmol/L)   Hemoglobin 10.5 (*) 12.0 - 15.0 (g/dL)   HCT 08.6 (*) 57.8 - 46.0 (%)  POCT I-STAT TROPONIN I      Component Value Range   Troponin i, poc 0.00  0.00 - 0.08 (ng/mL)   Comment 3            Ct Angio Chest W/cm &/or Wo Cm  12/08/2011  *RADIOLOGY REPORT*  Clinical Data: Chest pain  CT ANGIOGRAPHY CHEST  Technique:  Multidetector CT imaging of the chest using the standard protocol during bolus administration of intravenous contrast. Multiplanar reconstructed images including MIPs were obtained and reviewed to evaluate the vascular anatomy.  Contrast: OMNIPAQUE IOHEXOL 300 MG/ML IJ SOLN  Comparison: 09/23/2011  Findings: No filling defects in the pulmonary arterial tree to suggest acute pulmonary thromboembolism.  Negative abnormal mediastinal adenopathy.  No pericardial effusion.  No pneumothorax and no pleural effusion.  Lungs are under aerated without focal consolidation.  Mild hypoaeration changes present.  IMPRESSION: No evidence of acute pulmonary thromboembolism.  Original Report  Authenticated By: Donavan Burnet, M.D.    Discussed  with patient, diagnostic tests reassuring.   Can go home and continue her valium and hydrocodone and ibuprofen. Pt amenable.  Will give referral to [facility].           Keerthi Hazell A. Patrica Duel, MD 12/08/11 2116  Lorelle Gibbs. Patrica Duel, MD 12/08/11 2122

## 2011-12-08 NOTE — ED Notes (Signed)
Patient transported to CT 

## 2011-12-08 NOTE — ED Notes (Signed)
Bed:WA06<BR> Expected date:<BR> Expected time:<BR> Means of arrival:<BR> Comments:<BR> Triage 2

## 2011-12-08 NOTE — Discharge Instructions (Signed)
Back Pain, Adult Low back pain is very common. About 1 in 5 people have back pain.The cause of low back pain is rarely dangerous. The pain often gets better over time.About half of people with a sudden onset of back pain feel better in just 2 weeks. About 8 in 10 people feel better by 6 weeks.  CAUSES Some common causes of back pain include:  Strain of the muscles or ligaments supporting the spine.   Wear and tear (degeneration) of the spinal discs.   Arthritis.   Direct injury to the back.  DIAGNOSIS Most of the time, the direct cause of low back pain is not known.However, back pain can be treated effectively even when the exact cause of the pain is unknown.Answering your caregiver's questions about your overall health and symptoms is one of the most accurate ways to make sure the cause of your pain is not dangerous. If your caregiver needs more information, he or she may order lab work or imaging tests (X-rays or MRIs).However, even if imaging tests show changes in your back, this usually does not require surgery. HOME CARE INSTRUCTIONS For many people, back pain returns.Since low back pain is rarely dangerous, it is often a condition that people can learn to manageon their own.   Remain active. It is stressful on the back to sit or stand in one place. Do not sit, drive, or stand in one place for more than 30 minutes at a time. Take short walks on level surfaces as soon as pain allows.Try to increase the length of time you walk each day.   Do not stay in bed.Resting more than 1 or 2 days can delay your recovery.   Do not avoid exercise or work.Your body is made to move.It is not dangerous to be active, even though your back may hurt.Your back will likely heal faster if you return to being active before your pain is gone.   Pay attention to your body when you bend and lift. Many people have less discomfortwhen lifting if they bend their knees, keep the load close to their  bodies,and avoid twisting. Often, the most comfortable positions are those that put less stress on your recovering back.   Find a comfortable position to sleep. Use a firm mattress and lie on your side with your knees slightly bent. If you lie on your back, put a pillow under your knees.   Only take over-the-counter or prescription medicines as directed by your caregiver. Over-the-counter medicines to reduce pain and inflammation are often the most helpful.Your caregiver may prescribe muscle relaxant drugs.These medicines help dull your pain so you can more quickly return to your normal activities and healthy exercise.   Put ice on the injured area.   Put ice in a plastic bag.   Place a towel between your skin and the bag.   Leave the ice on for 15 to 20 minutes, 3 to 4 times a day for the first 2 to 3 days. After that, ice and heat may be alternated to reduce pain and spasms.   Ask your caregiver about trying back exercises and gentle massage. This may be of some benefit.   Avoid feeling anxious or stressed.Stress increases muscle tension and can worsen back pain.It is important to recognize when you are anxious or stressed and learn ways to manage it.Exercise is a great option.  SEEK MEDICAL CARE IF:  You have pain that is not relieved with rest or medicine.   You have   pain that does not improve in 1 week.   You have new symptoms.   You are generally not feeling well.  SEEK IMMEDIATE MEDICAL CARE IF:   You have pain that radiates from your back into your legs.   You develop new bowel or bladder control problems.   You have unusual weakness or numbness in your arms or legs.   You develop nausea or vomiting.   You develop abdominal pain.   You feel faint.  Document Released: 09/16/2005 Document Revised: 09/05/2011 Document Reviewed: 02/04/2011 [employer] Patient Information 2012 [employer]. 

## 2011-12-08 NOTE — ED Notes (Signed)
Pt from home with reports of upper, right back pain that radiates to right chest that started yesterday. Pt also endorses lightheadedness but denies nausea, no diaphoresis noted. Pt also reports hx of right pulmonary embolism after lumbar fusion in 2010.

## 2012-03-15 ENCOUNTER — Emergency Department (INDEPENDENT_AMBULATORY_CARE_PROVIDER_SITE_OTHER): Payer: Medicare Other

## 2012-03-15 ENCOUNTER — Emergency Department (INDEPENDENT_AMBULATORY_CARE_PROVIDER_SITE_OTHER)
Admission: EM | Admit: 2012-03-15 | Discharge: 2012-03-15 | Disposition: A | Discharge status: Home / Self Care | Payer: Medicare Other | Source: Home / Self Care | Attending: Emergency Medicine | Admitting: Emergency Medicine

## 2012-03-15 ENCOUNTER — Encounter (HOSPITAL_COMMUNITY): Payer: Self-pay | Admitting: *Deleted

## 2012-03-15 DIAGNOSIS — K5900 Constipation, unspecified: Secondary | ICD-10-CM

## 2012-03-15 MED ORDER — POLYETHYLENE GLYCOL 3350 17 G PO PACK: 17.0000 g | PACK | Freq: Every day | ORAL | Status: AC

## 2012-03-15 NOTE — Discharge Instructions (Signed)

## 2012-03-15 NOTE — ED Notes (Signed)
Co LUQ abd pain x 48 hrs.  Denies n&v, states she has been eating but has pain after eating, pain worse with taking deep breath, pain eased some after bm last pm.

## 2012-03-15 NOTE — ED Provider Notes (Signed)
History     CSN: [id number]  Arrival date & time 03/15/12  1202   First MD Initiated Contact with Patient 03/15/12 1351      Chief Complaint  Patient presents with  . Abdominal Pain    (Consider location/radiation/quality/duration/timing/severity/associated sxs/prior treatment) Patient is a 46 y.o. female presenting with abdominal pain. The history is provided by the patient. No language interpreter was used.  Abdominal Pain The primary symptoms of the illness include abdominal pain. The current episode started 13 to 24 hours ago. The onset of the illness was gradual. The problem has been gradually worsening.  Associated with: Pt admits constipation.  Pt is on vicodin for pain 2nd to a spinal fusion. The patient states that she believes she is currently not pregnant. The patient has had a change in bowel habit. Additional symptoms associated with the illness include constipation. Symptoms associated with the illness do not include chills or anorexia. Significant associated medical issues do not include PUD or diabetes.  Pt has been using senacot natural.  Pt reports pain in her left upper abdomen.    Past Medical History  Diagnosis Date  . DVT (deep venous thrombosis)   . Hypertension   . Heart disease   . Pulmonary embolism   . Ruptured lumbar disc     Past Surgical History  Procedure Date  . Abdominal hysterectomy   . Back surgery   . Lumbar fusion   . Uterine fibroids removed     History reviewed. No pertinent family history.  History  Substance Use Topics  . Smoking status: Never Smoker   . Smokeless tobacco: Never Used  . Alcohol Use: No    OB History    Grav Para Term Preterm Abortions TAB SAB Ect Mult Living                  Review of Systems  Constitutional: Negative for chills.  Gastrointestinal: Positive for abdominal pain and constipation. Negative for anorexia.  All other systems reviewed and are negative.    Allergies  Oxycodone hcl er  Home  Medications   Current Outpatient Rx  Name Route Sig Dispense Refill  . ASPIRIN EC 81 MG PO TBEC Oral Take 81 mg by mouth daily.      . CHLORPHENIRAMINE-ACETAMINOPHEN 2-325 MG PO TABS Oral Take 2 capsules by mouth once.    Marland Kitchen DIAZEPAM 5 MG PO TABS Oral Take 5 mg by mouth 3 (three) times daily.     Marland Kitchen DILTIAZEM HCL ER 180 MG PO CP24 Oral Take 180 mg by mouth daily.      Marland Kitchen FOLIC ACID 1 MG PO TABS Oral Take 1 mg by mouth daily.      Marland Kitchen HYDROCHLOROTHIAZIDE 25 MG PO TABS Oral Take 25 mg by mouth daily.      Marland Kitchen HYDROCODONE-ACETAMINOPHEN 5-500 MG PO TABS Oral Take 2 tablets by mouth every 6 (six) hours as needed. pain    . METHOTREXATE (ANTI-RHEUMATIC) 2.5 MG PO TABS Oral Take 12.5 mg by mouth once a week.    . VENLAFAXINE HCL ER 225 MG PO TB24 Oral Take 1 tablet by mouth daily.      Marland Kitchen VITAMIN C 500 MG PO TABS Oral Take 500 mg by mouth daily.      Marland Kitchen VITAMIN D (ERGOCALCIFEROL) 50000 UNITS PO CAPS Oral Take 50,000 Units by mouth every 7 (seven) days.      BP 122/87  Pulse 80  Temp 99.1 F (37.3 C) (Oral)  Resp  17  SpO2 100%  Physical Exam  Nursing note and vitals reviewed. Constitutional: She is oriented to person, place, and time. She appears well-developed and well-nourished.  HENT:  Head: Normocephalic and atraumatic.  Right Ear: External ear normal.  Left Ear: External ear normal.  Nose: Nose normal.  Mouth/Throat: Oropharynx is clear and moist.  Eyes: Conjunctivae and EOM are normal. Pupils are equal, round, and reactive to light.  Neck: Normal range of motion. Neck supple.  Cardiovascular: Normal rate, regular rhythm and normal heart sounds.   Pulmonary/Chest: Effort normal and breath sounds normal.  Abdominal: Soft. There is tenderness.  Musculoskeletal: Normal range of motion.  Neurological: She is alert and oriented to person, place, and time. She has normal reflexes.  Skin: Skin is warm and dry.  Psychiatric: She has a normal mood and affect.    ED Course  Procedures  (including critical care time)  Labs Reviewed - No data to display Dg Abd Acute W/chest  03/15/2012  *RADIOLOGY REPORT*  Clinical Data: The left upper quadrant abdominal pain.  ACUTE ABDOMEN SERIES (ABDOMEN 2 VIEW & CHEST 1 VIEW)  Comparison: Chest x-ray 09/23/2011.  Findings: Lung volumes are low.  No consolidative airspace disease. No definite pleural effusions.  Pulmonary vasculature and the cardiomediastinal silhouette are within normal limits.  Supine and upright views of the abdomen demonstrate gas and stool scattered throughout the colon extending to the level of the distal rectum.  No definite pathologic distension of small bowel is noted. Status post PLIF at L4-S1 with interbody grafts at L4-L5 and L5-S1.  IMPRESSION: 1.  Nonobstructive bowel gas pattern. 2.  No pneumoperitoneum. 3.  Low lung volumes without radiographic evidence of acute cardiopulmonary disease.  Original Report Authenticated By: Florencia Reasons, M.D.     1. Constipation       MDM  I suspect discomfort is from large sttol burden in left upper abdomen.   I advised Mag citrate.   Pt advised to return if fever ar increased pain.  Pt advised 24 hour recheck if symptoms not resolved with laxative or sooner if worse.     Lonia Skinner [place], [place] 03/15/12 1713  Lonia Skinner [place], [place] 03/15/12 [phone]

## 2012-03-16 NOTE — ED Provider Notes (Signed)
Medical screening examination/treatment/procedure(s) were performed by non-physician practitioner and as supervising physician I was immediately available for consultation/collaboration.  Luiz Blare MD   Luiz Blare, MD 03/16/12 1022

## 2012-04-08 ENCOUNTER — Encounter (HOSPITAL_COMMUNITY): Payer: Self-pay | Admitting: *Deleted

## 2012-04-08 ENCOUNTER — Emergency Department (HOSPITAL_COMMUNITY)
Admission: EM | Admit: 2012-04-08 | Discharge: 2012-04-08 | Disposition: A | Discharge status: Home / Self Care | Payer: Medicare Other | Attending: Emergency Medicine | Admitting: Emergency Medicine

## 2012-04-08 DIAGNOSIS — I10 Essential (primary) hypertension: Secondary | ICD-10-CM | POA: Insufficient documentation

## 2012-04-08 DIAGNOSIS — Z79899 Other long term (current) drug therapy: Secondary | ICD-10-CM | POA: Insufficient documentation

## 2012-04-08 DIAGNOSIS — Z7982 Long term (current) use of aspirin: Secondary | ICD-10-CM | POA: Insufficient documentation

## 2012-04-08 DIAGNOSIS — R259 Unspecified abnormal involuntary movements: Secondary | ICD-10-CM | POA: Insufficient documentation

## 2012-04-08 DIAGNOSIS — I519 Heart disease, unspecified: Secondary | ICD-10-CM | POA: Insufficient documentation

## 2012-04-08 DIAGNOSIS — Z86718 Personal history of other venous thrombosis and embolism: Secondary | ICD-10-CM | POA: Insufficient documentation

## 2012-04-08 DIAGNOSIS — E876 Hypokalemia: Secondary | ICD-10-CM | POA: Insufficient documentation

## 2012-04-08 DIAGNOSIS — R253 Fasciculation: Secondary | ICD-10-CM

## 2012-04-08 LAB — COMPREHENSIVE METABOLIC PANEL
ALT: 13 U/L (ref 0–35)
AST: 16 U/L (ref 0–37)
Albumin: 3.5 g/dL (ref 3.5–5.2)
CO2: 27 mEq/L (ref 19–32)
Chloride: 101 mEq/L (ref 96–112)
Creatinine, Ser: 0.75 mg/dL (ref 0.50–1.10)
Sodium: 136 mEq/L (ref 135–145)
Total Bilirubin: 0.1 mg/dL — ABNORMAL LOW (ref 0.3–1.2)

## 2012-04-08 LAB — CBC WITH DIFFERENTIAL/PLATELET
Basophils Relative: 0 % (ref 0–1)
Eosinophils Absolute: 0.1 10*3/uL (ref 0.0–0.7)
Eosinophils Relative: 2 % (ref 0–5)
Hemoglobin: 10.6 g/dL — ABNORMAL LOW (ref 12.0–15.0)
Lymphs Abs: 2 10*3/uL (ref 0.7–4.0)
MCH: 29.6 pg (ref 26.0–34.0)
MCHC: 33.4 g/dL (ref 30.0–36.0)
MCV: 88.5 fL (ref 78.0–100.0)
Monocytes Relative: 6 % (ref 3–12)
RBC: 3.58 MIL/uL — ABNORMAL LOW (ref 3.87–5.11)

## 2012-04-08 MED ORDER — CYCLOBENZAPRINE HCL 10 MG PO TABS
10.0000 mg | ORAL_TABLET | Freq: Once | ORAL | Status: AC
  Administered 2012-04-08: 10 mg via ORAL
  Filled 2012-04-08: qty 1

## 2012-04-08 MED ORDER — POTASSIUM CHLORIDE ER 10 MEQ PO TBCR: 20.0000 meq | EXTENDED_RELEASE_TABLET | Freq: Two times a day (BID) | ORAL | Status: DC

## 2012-04-08 MED ORDER — POTASSIUM CHLORIDE CRYS ER 20 MEQ PO TBCR
40.0000 meq | EXTENDED_RELEASE_TABLET | Freq: Once | ORAL | Status: AC
  Administered 2012-04-08: 40 meq via ORAL
  Filled 2012-04-08: qty 2

## 2012-04-08 MED ORDER — CYCLOBENZAPRINE HCL 10 MG PO TABS: 10.0000 mg | ORAL_TABLET | Freq: Three times a day (TID) | ORAL | Status: AC | PRN

## 2012-04-08 MED ORDER — KETOROLAC TROMETHAMINE 60 MG/2ML IM SOLN
60.0000 mg | Freq: Once | INTRAMUSCULAR | Status: AC
  Administered 2012-04-08: 60 mg via INTRAMUSCULAR
  Filled 2012-04-08: qty 2

## 2012-04-08 NOTE — ED Notes (Signed)
VHQ:[id number]<EX> Expected date:<BR> Expected time:<BR> Means of arrival:<BR> Comments:<BR> Hold ems twitching

## 2012-04-08 NOTE — ED Notes (Signed)
Pt called EMS for chronic back pain that exacerbated to "full body twitching for two weeks". Pt was prescribed meds from the [facility] hospital in [place] for diazepam and meloxicam. Was told to come back last wknd, but pt waited til today. Pt ambulated to the truck without difficulty or assistance, then reported chronic back pain 10/10. VSS. BP 126/80, pulse 70, RR 14.

## 2012-04-08 NOTE — ED Notes (Addendum)
Pt reports having spinal surgery a couple of years ago and having chronic pain from the surgery. States that she was prescribed valium and Meloxicam for the pain but she has now started having full body cramping and headaches. States cramping is "like twitches". Was told to come to the ER for further evaluation. Pt neuro intact. Alert and oriented x 4.  Pt states twitching only happens at night.

## 2012-04-08 NOTE — ED Provider Notes (Signed)
History     CSN: [id number]  Arrival date & time 04/08/12  1701   First MD Initiated Contact with Patient 04/08/12 1739      Chief Complaint  Patient presents with  . Back Pain    (Consider location/radiation/quality/duration/timing/severity/associated sxs/prior treatment) HPI  Patient reports she is followed at the [facility] and she's been evaluated for polyarthritis. She reports for the past 3 months she's been having some head jerking or pulling and also in her feet. She relates it got worse a couple days ago. She called the [facility] on July 6 and they told her to go to the ER. She states she's never had jerking before. She states her doctors did start her on Mobic for the jerking about one week ago. She relates the head jerking is not up and down but is from side to side and has made her head hurt in her neck hurt. She states laying down makes the symptoms worse but being active or walking makes the symptoms go away.  PCP [facility]  Past Medical History  Diagnosis Date  . DVT (deep venous thrombosis)   . Hypertension   . Heart disease   . Pulmonary embolism   . Ruptured lumbar disc     Past Surgical History  Procedure Date  . Abdominal hysterectomy   . Back surgery   . Lumbar fusion   . Uterine fibroids removed     No family history on file.  History  Substance Use Topics  . Smoking status: Never Smoker   . Smokeless tobacco: Never Used  . Alcohol Use: No  lives at home Lives alone Uses a cane  OB History    Grav Para Term Preterm Abortions TAB SAB Ect Mult Living                  Review of Systems  All other systems reviewed and are negative.    Allergies  Oxycodone hcl er  Home Medications   Current Outpatient Rx  Name Route Sig Dispense Refill  . ASPIRIN EC 81 MG PO TBEC Oral Take 81 mg by mouth daily.      Marland Kitchen DIAZEPAM 5 MG PO TABS Oral Take 5 mg by mouth every 8 (eight) hours as needed. anxiety    . DILTIAZEM HCL ER 180 MG PO CP24  Oral Take 180 mg by mouth daily.      Marland Kitchen FOLIC ACID 1 MG PO TABS Oral Take 1 mg by mouth daily.      Marland Kitchen GABAPENTIN 100 MG PO CAPS Oral Take 100 mg by mouth 1 day or 1 dose.    Marland Kitchen HYDROCHLOROTHIAZIDE 25 MG PO TABS Oral Take 25 mg by mouth daily.      Marland Kitchen HYDROCODONE-ACETAMINOPHEN 5-500 MG PO TABS Oral Take 2 tablets by mouth every 6 (six) hours as needed. pain    . MELOXICAM 15 MG PO TABS Oral Take 7.5 mg by mouth daily.    Marland Kitchen METHOTREXATE (ANTI-RHEUMATIC) 2.5 MG PO TABS Oral Take 12.5 mg by mouth once a week. On saturdays    . VENLAFAXINE HCL ER 225 MG PO TB24 Oral Take 1 tablet by mouth daily.      Marland Kitchen VITAMIN C 500 MG PO TABS Oral Take 500 mg by mouth daily.      Marland Kitchen VITAMIN D (ERGOCALCIFEROL) 50000 UNITS PO CAPS Oral Take 50,000 Units by mouth every 7 (seven) days. On saturdays    . CYCLOBENZAPRINE HCL 10 MG PO TABS  Oral Take 1 tablet (10 mg total) by mouth 3 (three) times daily as needed for muscle spasms. 30 tablet 0    BP 109/66  Pulse 74  Temp 98 F (36.7 C) (Oral)  Resp 16  SpO2 100%  Vital signs normal    Physical Exam  Nursing note and vitals reviewed. Constitutional: She is oriented to person, place, and time. She appears well-developed and well-nourished.  Non-toxic appearance. She does not appear ill. No distress.       The only head jerking I observe is when the patient shows me how her head jerks.  HENT:  Head: Normocephalic and atraumatic.  Right Ear: External ear normal.  Left Ear: External ear normal.  Nose: Nose normal. No mucosal edema or rhinorrhea.  Mouth/Throat: Oropharynx is clear and moist and mucous membranes are normal. No dental abscesses or uvula swelling.  Eyes: Conjunctivae and EOM are normal. Pupils are equal, round, and reactive to light.  Neck: Normal range of motion and full passive range of motion without pain. Neck supple.       Patient has tenderness to the muscles in her neck  Cardiovascular: Normal rate, regular rhythm and normal heart sounds.  Exam  reveals no gallop and no friction rub.   No murmur heard. Pulmonary/Chest: Effort normal and breath sounds normal. No respiratory distress. She has no wheezes. She has no rhonchi. She has no rales. She exhibits no tenderness and no crepitus.  Abdominal: Soft. Normal appearance and bowel sounds are normal. She exhibits no distension. There is no tenderness. There is no rebound and no guarding.  Musculoskeletal: Normal range of motion. She exhibits no edema and no tenderness.       Moves all extremities well.   Neurological: She is alert and oriented to person, place, and time. She has normal strength. No cranial nerve deficit.  Skin: Skin is warm, dry and intact. No rash noted. No erythema. No pallor.  Psychiatric: She has a normal mood and affect. Her speech is normal and behavior is normal. Her mood appears not anxious.    ED Course  Procedures (including critical care time)  Medications  ketorolac (TORADOL) injection 60 mg (60 mg Intramuscular Given 04/08/12 1845)  cyclobenzaprine (FLEXERIL) tablet 10 mg (10 mg Oral Given 04/08/12 1845)  potassium chloride SA (K-DUR,KLOR-CON) CR tablet 40 mEq (40 mEq Oral Given 04/08/12 2042)   Patient calmly watching TV no jerking noted. Review of gabapentin which is also a new medication for her reveals that can call some dystonic jerking or tremor.   Results for orders placed during the hospital encounter of 04/08/12  COMPREHENSIVE METABOLIC PANEL      Component Value Range   Sodium 136  135 - 145 mEq/L   Potassium 3.2 (*) 3.5 - 5.1 mEq/L   Chloride 101  96 - 112 mEq/L   CO2 27  19 - 32 mEq/L   Glucose, Bld 118 (*) 70 - 99 mg/dL   BUN 21  6 - 23 mg/dL   Creatinine, Ser 4.54  0.50 - 1.10 mg/dL   Calcium 9.6  8.4 - 09.8 mg/dL   Total Protein 7.1  6.0 - 8.3 g/dL   Albumin 3.5  3.5 - 5.2 g/dL   AST 16  0 - 37 U/L   ALT 13  0 - 35 U/L   Alkaline Phosphatase 62  39 - 117 U/L   Total Bilirubin 0.1 (*) 0.3 - 1.2 mg/dL   GFR calc non Af Amer >90  >  90  mL/min   GFR calc Af Amer >90  >90 mL/min  CBC WITH DIFFERENTIAL      Component Value Range   WBC 4.2  4.0 - 10.5 K/uL   RBC 3.58 (*) 3.87 - 5.11 MIL/uL   Hemoglobin 10.6 (*) 12.0 - 15.0 g/dL   HCT 16.1 (*) 09.6 - 04.5 %   MCV 88.5  78.0 - 100.0 fL   MCH 29.6  26.0 - 34.0 pg   MCHC 33.4  30.0 - 36.0 g/dL   RDW 40.9  81.1 - 91.4 %   Platelets 229  150 - 400 K/uL   Neutrophils Relative 45  43 - 77 %   Neutro Abs 1.9  1.7 - 7.7 K/uL   Lymphocytes Relative 47 (*) 12 - 46 %   Lymphs Abs 2.0  0.7 - 4.0 K/uL   Monocytes Relative 6  3 - 12 %   Monocytes Absolute 0.3  0.1 - 1.0 K/uL   Eosinophils Relative 2  0 - 5 %   Eosinophils Absolute 0.1  0.0 - 0.7 K/uL   Basophils Relative 0  0 - 1 %   Basophils Absolute 0.0  0.0 - 0.1 K/uL  MAGNESIUM      Component Value Range   Magnesium 2.2  1.5 - 2.5 mg/dL   Laboratory interpretation all normal except hypokalemia  Dg Abd Acute W/chest  03/15/2012  *RADIOLOGY REPORT*  Clinical Data: The left upper quadrant abdominal pain.  ACUTE ABDOMEN SERIES (ABDOMEN 2 VIEW & CHEST 1 VIEW)  Comparison: Chest x-ray 09/23/2011.  Findings: Lung volumes are low.  No consolidative airspace disease. No definite pleural effusions.  Pulmonary vasculature and the cardiomediastinal silhouette are within normal limits.  Supine and upright views of the abdomen demonstrate gas and stool scattered throughout the colon extending to the level of the distal rectum.  No definite pathologic distension of small bowel is noted. Status post PLIF at L4-S1 with interbody grafts at L4-L5 and L5-S1.  IMPRESSION: 1.  Nonobstructive bowel gas pattern. 2.  No pneumoperitoneum. 3.  Low lung volumes without radiographic evidence of acute cardiopulmonary disease.  Original Report Authenticated By: Florencia Reasons, M.D.    1. Jerking   2. Hypokalemia    New Prescriptions   CYCLOBENZAPRINE (FLEXERIL) 10 MG TABLET    Take 1 tablet (10 mg total) by mouth 3 (three) times daily as needed for  muscle spasms.   POTASSIUM CHLORIDE (K-DUR) 10 MEQ TABLET    Take 2 tablets (20 mEq total) by mouth 2 (two) times daily.     Plan discharge  Devoria Albe, MD, FACEP    MDM          Ward Givens, MD 04/08/12 2113

## 2012-06-24 ENCOUNTER — Emergency Department (HOSPITAL_COMMUNITY)
Admission: EM | Admit: 2012-06-24 | Discharge: 2012-06-24 | Discharge status: Against Medical Advice | Payer: Medicare Other | Attending: Emergency Medicine | Admitting: Emergency Medicine

## 2012-06-24 ENCOUNTER — Encounter (HOSPITAL_COMMUNITY): Payer: Self-pay | Admitting: Family Medicine

## 2012-06-24 DIAGNOSIS — Z0389 Encounter for observation for other suspected diseases and conditions ruled out: Secondary | ICD-10-CM | POA: Insufficient documentation

## 2012-06-24 NOTE — ED Notes (Addendum)
Pt sts that she was on the way to get her BP medication and started to feel " Funny". sts she has head pain, neck pain, and she feels unbalanced like she is going to pass out. Pt denies numbness and tingling. Pt denies one sided weakness. sts weakness all over. Grips equal. No facial droop noted. A&Ox3. Denies chest pain, SOB.

## 2012-06-24 NOTE — ED Notes (Signed)
Called for triage x3, no answer. 

## 2012-06-24 NOTE — ED Notes (Signed)
Still no answer in WR. Patient not in bathroom or any other waiting rooms/

## 2012-06-25 ENCOUNTER — Encounter (HOSPITAL_COMMUNITY): Payer: Self-pay | Admitting: Emergency Medicine

## 2012-06-25 ENCOUNTER — Emergency Department (HOSPITAL_COMMUNITY)
Admission: EM | Admit: 2012-06-25 | Discharge: 2012-06-25 | Disposition: A | Discharge status: Nursing Home (Includes ICF) | Payer: Medicare Other | Source: Home / Self Care | Attending: Emergency Medicine | Admitting: Emergency Medicine

## 2012-06-25 ENCOUNTER — Emergency Department (HOSPITAL_COMMUNITY): Payer: Medicare Other

## 2012-06-25 ENCOUNTER — Encounter (HOSPITAL_COMMUNITY): Payer: Self-pay

## 2012-06-25 ENCOUNTER — Emergency Department (HOSPITAL_COMMUNITY)
Admission: EM | Admit: 2012-06-25 | Discharge: 2012-06-25 | Disposition: A | Discharge status: Home / Self Care | Payer: Medicare Other | Attending: Emergency Medicine | Admitting: Emergency Medicine

## 2012-06-25 DIAGNOSIS — R51 Headache: Secondary | ICD-10-CM | POA: Insufficient documentation

## 2012-06-25 DIAGNOSIS — M79609 Pain in unspecified limb: Secondary | ICD-10-CM | POA: Insufficient documentation

## 2012-06-25 DIAGNOSIS — I10 Essential (primary) hypertension: Secondary | ICD-10-CM | POA: Insufficient documentation

## 2012-06-25 DIAGNOSIS — Z79899 Other long term (current) drug therapy: Secondary | ICD-10-CM | POA: Insufficient documentation

## 2012-06-25 DIAGNOSIS — M79606 Pain in leg, unspecified: Secondary | ICD-10-CM

## 2012-06-25 DIAGNOSIS — Z7982 Long term (current) use of aspirin: Secondary | ICD-10-CM | POA: Insufficient documentation

## 2012-06-25 DIAGNOSIS — Z86718 Personal history of other venous thrombosis and embolism: Secondary | ICD-10-CM | POA: Insufficient documentation

## 2012-06-25 DIAGNOSIS — I519 Heart disease, unspecified: Secondary | ICD-10-CM | POA: Insufficient documentation

## 2012-06-25 LAB — URINALYSIS, ROUTINE W REFLEX MICROSCOPIC
Glucose, UA: NEGATIVE mg/dL
Leukocytes, UA: NEGATIVE
Protein, ur: NEGATIVE mg/dL
Specific Gravity, Urine: 1.031 — ABNORMAL HIGH (ref 1.005–1.030)
pH: 6 (ref 5.0–8.0)

## 2012-06-25 LAB — CBC WITH DIFFERENTIAL/PLATELET
Basophils Absolute: 0 10*3/uL (ref 0.0–0.1)
Lymphocytes Relative: 31 % (ref 12–46)
Neutro Abs: 3.7 10*3/uL (ref 1.7–7.7)
Neutrophils Relative %: 64 % (ref 43–77)
Platelets: 241 10*3/uL (ref 150–400)
RBC: 4.21 MIL/uL (ref 3.87–5.11)
RDW: 12.7 % (ref 11.5–15.5)
WBC: 5.7 10*3/uL (ref 4.0–10.5)

## 2012-06-25 LAB — BASIC METABOLIC PANEL
CO2: 25 mEq/L (ref 19–32)
Calcium: 9.9 mg/dL (ref 8.4–10.5)
Chloride: 101 mEq/L (ref 96–112)
Potassium: 3.2 mEq/L — ABNORMAL LOW (ref 3.5–5.1)
Sodium: 138 mEq/L (ref 135–145)

## 2012-06-25 MED ORDER — KETOROLAC TROMETHAMINE 60 MG/2ML IM SOLN
60.0000 mg | Freq: Once | INTRAMUSCULAR | Status: AC
  Administered 2012-06-25: 60 mg via INTRAMUSCULAR
  Filled 2012-06-25: qty 2

## 2012-06-25 MED ORDER — IBUPROFEN 400 MG PO TABS
800.0000 mg | ORAL_TABLET | Freq: Once | ORAL | Status: AC
  Administered 2012-06-25: 800 mg via ORAL
  Filled 2012-06-25: qty 2

## 2012-06-25 MED ORDER — METOCLOPRAMIDE HCL 5 MG/ML IJ SOLN
10.0000 mg | Freq: Once | INTRAMUSCULAR | Status: AC
  Administered 2012-06-25: 10 mg via INTRAMUSCULAR
  Filled 2012-06-25: qty 2

## 2012-06-25 MED ORDER — DIPHENHYDRAMINE HCL 50 MG/ML IJ SOLN
25.0000 mg | Freq: Once | INTRAMUSCULAR | Status: AC
  Administered 2012-06-25: 25 mg via INTRAMUSCULAR
  Filled 2012-06-25: qty 1

## 2012-06-25 MED ORDER — HYDROCODONE-ACETAMINOPHEN 5-325 MG PO TABS
2.0000 | ORAL_TABLET | Freq: Once | ORAL | Status: DC
  Filled 2012-06-25: qty 2

## 2012-06-25 NOTE — ED Provider Notes (Signed)
History     CSN: [id number]  Arrival date & time 06/25/12  1459   First MD Initiated Contact with Patient 06/25/12 1518      Chief Complaint  Patient presents with  . Leg Pain    (Consider location/radiation/quality/duration/timing/severity/associated sxs/prior treatment) HPI Comments: Patient presents urgent care describing right lower extremity pain for about 4 days as well as in her upper back she's also has a headache has been persistent for several days. She denies any trauma recent falls or fevers. 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 It hurts to walk or put weight on her leg and has noted a area of soft tissue swelling on the inner aspect of her right lower extremity below the knee. Patient denies any chest pains, shortness of breath or palpitations. She goes into describing that she was discontinued from anticoagulation treatment 2011 as it was thought that the initial DVT and PE was caused by immobilization after her back surgery.  Patient is a 46 y.o. female presenting with leg pain. The history is provided by the patient.  Leg Pain  The incident occurred more than 2 days ago. There was no injury mechanism. The pain is present in the right leg. The quality of the pain is described as aching. The pain is at a severity of 6/10. The pain is moderate. The pain has been constant since onset. Associated symptoms include loss of motion. Pertinent negatives include no numbness, no inability to bear weight, no loss of sensation and no tingling. The symptoms are aggravated by activity. The treatment provided no relief.    Past Medical History  Diagnosis Date  . DVT (deep venous thrombosis)   . Hypertension   . Heart disease   . Pulmonary embolism   . Ruptured lumbar disc     Past Surgical History  Procedure Date  . Abdominal hysterectomy   . Back surgery   . Lumbar fusion   . Uterine fibroids removed      History reviewed. No pertinent family history.  History  Substance Use Topics  . Smoking status: Never Smoker   . Smokeless tobacco: Never Used  . Alcohol Use: No    OB History    Grav Para Term Preterm Abortions TAB SAB Ect Mult Living                  Review of Systems  Constitutional: Positive for activity change. Negative for fever, chills, diaphoresis and fatigue.  Respiratory: Negative for shortness of breath.   Cardiovascular: Positive for leg swelling. Negative for chest pain and palpitations.  Skin: Negative for color change, pallor and rash.  Neurological: Negative for dizziness, tingling and numbness.    Allergies  Oxycodone hcl er  Home Medications   Current Outpatient Rx  Name Route Sig Dispense Refill  . ASPIRIN EC 81 MG PO TBEC Oral Take 81 mg by mouth daily.      Marland Kitchen DILTIAZEM HCL ER 180 MG PO CP24 Oral Take 180 mg by mouth daily.      Marland Kitchen FOLIC ACID 1 MG PO TABS Oral Take 1 mg by mouth daily.      Marland Kitchen HYDROCHLOROTHIAZIDE 25 MG PO TABS Oral Take 25 mg by mouth daily.      Marland Kitchen HYDROCODONE-ACETAMINOPHEN 10-500 MG PO TABS Oral Take 1 tablet by mouth every 6 (six) hours as needed. For pain    . MELOXICAM 15  MG PO TABS Oral Take 7.5 mg by mouth daily.    Marland Kitchen METHOTREXATE (ANTI-RHEUMATIC) 2.5 MG PO TABS Oral Take 12.5 mg by mouth once a week. On saturdays    . SENNOSIDES-DOCUSATE SODIUM 8.6-50 MG PO TABS Oral Take 2 tablets by mouth 2 (two) times daily.    . VENLAFAXINE HCL ER 225 MG PO TB24 Oral Take 1 tablet by mouth daily.        BP 130/91  Pulse 82  Temp 98.9 F (37.2 C) (Oral)  Resp 17  SpO2 98%  Physical Exam  Nursing note and vitals reviewed. Constitutional: She appears well-developed and well-nourished.  Musculoskeletal: She exhibits tenderness.       Legs: Neurological: She is alert.  Skin: No rash noted. No erythema.    ED Course  Procedures (including critical care time)  Labs Reviewed - No data to display No results found.   1. Leg  pain       MDM  Patient with concern for DVT on her right lower extremity was factors and exam favors a moderate modify walls criteria have been transfer to the emergency department with the intention of patient obtaining a venous Doppler study and potentially further evaluation and management as per results. Patient is hemodynamically stable and has no further cardiovascular complaints to be suspicious of a coexistent PE.        Jimmie Molly, MD 06/25/12 [phone]

## 2012-06-25 NOTE — Progress Notes (Addendum)
*  PRELIMINARY RESULTS* Vascular Ultrasound  Patient report pain in right lower extremity. Had RN change order to right.  Right lower extremity venous duplex has been completed.  Preliminary findings: no evidence of DVT or baker's cyst.  Farrel Demark, RDMS, RVT  06/25/2012, 6:19 PM

## 2012-06-25 NOTE — ED Provider Notes (Signed)
History  Scribed for Glynn Octave, MD, the patient was seen in room TR04C/TR04C. This chart was scribed by Candelaria Stagers. The patient's care started at 6:11 PM   CSN: [id number]  Arrival date & time 06/25/12  1622   First MD Initiated Contact with Patient 06/25/12 1806      Chief Complaint  Patient presents with  . Headache  . Leg Pain     Patient is a 46 y.o. female presenting with leg pain. The history is provided by the patient. No language interpreter was used.  Leg Pain    [name] is a 46 y.o. female who presents to the Emergency Department complaining of a headache that started four days ago.  Pt reports she was off of her blood pressure medication for 2-3 days and began taking it again three days ago.  She states that the headache gets worse when she is lying down.  She denies chest pain, SOB, cough, fever, blurry vision, or abdominal pain.  She is also experiencing right leg pain that started three days ago with hematomas to the right lower leg.  She denies hitting the leg.  She states she has also been experiencing lower back pain over the last six months.  Pt has h/o pulmonary embolism.  Her PCP is at the [facility] hospital.   Past Medical History  Diagnosis Date  . DVT (deep venous thrombosis)   . Hypertension   . Heart disease   . Pulmonary embolism   . Ruptured lumbar disc     Past Surgical History  Procedure Date  . Abdominal hysterectomy   . Back surgery   . Lumbar fusion   . Uterine fibroids removed     History reviewed. No pertinent family history.  History  Substance Use Topics  . Smoking status: Never Smoker   . Smokeless tobacco: Never Used  . Alcohol Use: No    OB History    Grav Para Term Preterm Abortions TAB SAB Ect Mult Living                  Review of Systems A complete 10 system review of systems was obtained and all systems are negative except as noted in the HPI and PMH.   Allergies  Oxycodone hcl er  Home Medications    Current Outpatient Rx  Name Route Sig Dispense Refill  . ASPIRIN EC 81 MG PO TBEC Oral Take 81 mg by mouth daily.      Marland Kitchen DILTIAZEM HCL ER 180 MG PO CP24 Oral Take 180 mg by mouth daily.      Marland Kitchen FOLIC ACID 1 MG PO TABS Oral Take 1 mg by mouth daily.      Marland Kitchen HYDROCHLOROTHIAZIDE 25 MG PO TABS Oral Take 25 mg by mouth daily.      Marland Kitchen HYDROCODONE-ACETAMINOPHEN 10-500 MG PO TABS Oral Take 1 tablet by mouth every 6 (six) hours as needed. For pain    . MELOXICAM 15 MG PO TABS Oral Take 7.5 mg by mouth daily.    Marland Kitchen METHOTREXATE (ANTI-RHEUMATIC) 2.5 MG PO TABS Oral Take 12.5 mg by mouth once a week. On saturdays    . SENNOSIDES-DOCUSATE SODIUM 8.6-50 MG PO TABS Oral Take 2 tablets by mouth 2 (two) times daily.    . VENLAFAXINE HCL ER 225 MG PO TB24 Oral Take 1 tablet by mouth daily.        BP 132/86  Pulse 78  Temp 97.8 F (36.6 C) (Oral)  Resp  16  SpO2 98%  Physical Exam  Nursing note and vitals reviewed. Constitutional: She is oriented to person, place, and time. She appears well-developed and well-nourished. No distress.  HENT:  Head: Normocephalic and atraumatic.  Eyes: EOM are normal. Pupils are equal, round, and reactive to light.  Neck: Neck supple. No tracheal deviation present.  Cardiovascular: Normal rate.   Pulmonary/Chest: Effort normal. No respiratory distress.  Abdominal: Soft. She exhibits no distension.  Musculoskeletal: Normal range of motion. She exhibits no edema.       Diffuse left calf swelling without pain.  Ecchymosis to proximal tibia. +2 DP Pt pulses.  Romberg negative.  Normal gait.      Neurological: She is alert and oriented to person, place, and time. No sensory deficit.       No ataxia, no nystagmus. 5/5 strenght throughout.  Visual fields intact.   Skin: Skin is warm and dry.  Psychiatric: She has a normal mood and affect. Her behavior is normal.    ED Course  Procedures  DIAGNOSTIC STUDIES: Oxygen Saturation is 98% on room air, normal by my  interpretation.    COORDINATION OF CARE:   16:31 Ordered: CT head Wo Contrast; CBC with Differential; Basic metabolic panel; Urinalysis, Routine w reflex microscopic  Labs Reviewed  BASIC METABOLIC PANEL - Abnormal; Notable for the following:    Potassium 3.2 (*)     GFR calc non Af Amer 75 (*)     GFR calc Af Amer 87 (*)     All other components within normal limits  CBC WITH DIFFERENTIAL  URINALYSIS, ROUTINE W REFLEX MICROSCOPIC   Ct Head Wo Contrast  06/25/2012  *RADIOLOGY REPORT*  Clinical Data: Headaches for the past 4 days.  History of hypertension.  CT HEAD WITHOUT CONTRAST  Technique:  Contiguous axial images were obtained from the base of the skull through the vertex without contrast.  Comparison: Head CT 08/26/2009.  Findings: No acute intracranial abnormality.  Specifically, no evidence of acute intracranial hemorrhage, no definite region of acute/subacute cerebral ischemia, no focal mass, mass effect, hydrocephalus or abnormal intra or extra-axial fluid collections. Visualized paranasal sinuses and mastoids are well pneumatized.  No acute displaced skull fractures are noted.  IMPRESSION: 1.  No acute intracranial abnormalities. 2.  The appearance of the brain is normal.   Original Report Authenticated By: Florencia Reasons, M.D.      No diagnosis found.    MDM  Sent from urgent care with right lower extremity pain and swelling for the past 4 days. History of DVT and PE. She is no longer on Coumadin. Denies trauma. No chest pain, shortness of breath or palpitations. Also endorses gradual onset headache for the past 3 days.  Denies thunderclap onset. Headache is similar to previous headaches. No change in location or character.  5/5 strength throughout, no ataxia, normal gait, no vision change, visual fields full. Romberg negative, no pronator drift.  Venous doppler negative for DVT. No CP, SOB, tachycardia or hypoxia to suggest PE. CT head neg. Headache improved with  medications.  No neuro deficits. No evidence of end organ damage.  I personally performed the services described in this documentation, which was scribed in my presence.  The recorded information has been reviewed and considered.        Glynn Octave, MD 06/25/12 1950

## 2012-06-25 NOTE — ED Notes (Signed)
Patient is concerned with her right leg and possible blood clots, also states she has a headache , upper back pain for 4 days, went to ER yesterday and left before being seen due to wait

## 2012-06-25 NOTE — ED Notes (Signed)
Pt sent here from [facility] for eval of left leg pain and swelling with hx of DVT; pt sts out of BP meds until today and having HA

## 2014-09-05 ENCOUNTER — Emergency Department (HOSPITAL_COMMUNITY)
Admission: EM | Admit: 2014-09-05 | Discharge: 2014-09-05 | Discharge status: Absent without leave | Payer: Non-veteran care | Source: Home / Self Care

## 2016-02-06 ENCOUNTER — Encounter (HOSPITAL_COMMUNITY): Payer: Self-pay | Admitting: Emergency Medicine

## 2016-02-06 ENCOUNTER — Ambulatory Visit (HOSPITAL_COMMUNITY)
Admission: EM | Admit: 2016-02-06 | Discharge: 2016-02-06 | Disposition: A | Discharge status: Home / Self Care | Payer: Medicare Other | Attending: Family Medicine | Admitting: Family Medicine

## 2016-02-06 ENCOUNTER — Ambulatory Visit (INDEPENDENT_AMBULATORY_CARE_PROVIDER_SITE_OTHER): Payer: Medicare Other

## 2016-02-06 DIAGNOSIS — M25531 Pain in right wrist: Secondary | ICD-10-CM | POA: Diagnosis not present

## 2016-02-06 MED ORDER — TRAMADOL HCL 50 MG PO TABS: 50.0000 mg | ORAL_TABLET | Freq: Two times a day (BID) | ORAL | Status: DC | PRN

## 2016-02-06 NOTE — ED Notes (Signed)
Right wrist pain since 8/15.  No known injury.  Points to posterior wrist as location of pain.  There is a puffy area at this location.  Patient is right handed

## 2016-02-06 NOTE — Discharge Instructions (Signed)
It is a pleasure to see you today for the pain in your right wrist.   I believe the pain represents a flare up of your arthritis.  I recommend contacting your rheumatologist, or your primary care physician.   I recommend you take Tylenol 650mg , by mouth, every 4 to 6hours as needed for pain.   Additionally, I am giving you a prescription for Tramadol 50mg , you may take 1 tablet by mouth every 12 hours as needed for pain. Take first dose when you are at home to see how the medicine affects you.  It may cause drowsiness.

## 2016-02-06 NOTE — ED Provider Notes (Signed)
CSN: [id number]     Arrival date & time 02/06/16  1408 History   First MD Initiated Contact with Patient 02/06/16 1523     Chief Complaint  Patient presents with  . Wrist Pain   (Consider location/radiation/quality/duration/timing/severity/associated sxs/prior Treatment) Patient is a 50 y.o. female presenting with wrist pain. The history is provided by the patient. No language interpreter was used.  Wrist Pain  Patient presents with acute onset R wrist pain, began 1 to 2 days ago, gradual onset. Accompanied by swelling and warmth.  No trauma history.  Patient has a diagnosis of arthritis for which she takes MTX 12.5mg  once weekly, is managed by a rheumatologist.   ROS: No fevers or chills; no falls or trauma to wrist.    Past Medical History  Diagnosis Date  . DVT (deep venous thrombosis) ([place])   . Hypertension   . Heart disease   . Pulmonary embolism ([place])   . Ruptured lumbar disc    Past Surgical History  Procedure Laterality Date  . Abdominal hysterectomy    . Back surgery    . Lumbar fusion    . Uterine fibroids removed     No family history on file. Social History  Substance Use Topics  . Smoking status: Never Smoker   . Smokeless tobacco: Never Used  . Alcohol Use: No   OB History    No data available     Review of Systems  Allergies  Oxycodone hcl  Home Medications   Prior to Admission medications   Medication Sig Start Date End Date Taking? Authorizing Provider  aspirin EC 81 MG tablet Take 81 mg by mouth daily.      Historical Provider, MD  diltiazem (DILACOR XR) 180 MG 24 hr capsule Take 180 mg by mouth daily.      Historical Provider, MD  folic acid (FOLVITE) 1 MG tablet Take 1 mg by mouth daily.      Historical Provider, MD  hydrochlorothiazide (HYDRODIURIL) 25 MG tablet Take 25 mg by mouth daily.      Historical Provider, MD  HYDROcodone-acetaminophen (LORTAB) 10-500 MG per tablet Take 1 tablet by mouth every 6 (six) hours as needed. For pain     Historical Provider, MD  meloxicam (MOBIC) 15 MG tablet Take 7.5 mg by mouth daily.    Historical Provider, MD  methotrexate (RHEUMATREX) 2.5 MG tablet Take 12.5 mg by mouth once a week. On saturdays    Historical Provider, MD  senna-docusate (SENNALAX-S) 8.6-50 MG per tablet Take 2 tablets by mouth 2 (two) times daily.    Historical Provider, MD  Venlafaxine HCl 225 MG TB24 Take 1 tablet by mouth daily.      Historical Provider, MD   Meds Ordered and Administered this Visit  Medications - No data to display  BP 127/90 mmHg  Pulse 77  Temp(Src) 98.6 F (37 C) (Oral)  Resp 18  SpO2 100% No data found.   Physical Exam  Constitutional: She appears well-developed and well-nourished. No distress.  HENT:  Head: Normocephalic and atraumatic.  Neck: Neck supple.  Musculoskeletal:  R wrist with mild edema, calor and rubor. No ecchymosis. No tenderness over anatomic snuff box.   Able to grip, decreased strength when compared with L wrist.  Palpable radial and ulnar pulses bilat.  Sensation in distal tips of fingers of both hands intact and symmetric.  Brisk cap refill <2 seconds  R wrist flexion and extension limited by pain.    Skin: She is  not diaphoretic.    ED Course  Procedures (including critical care time)  Labs Review Labs Reviewed - No data to display  Imaging Review No results found.   Visual Acuity Review  Right Eye Distance:   Left Eye Distance:   Bilateral Distance:    Right Eye Near:   Left Eye Near:    Bilateral Near:         MDM   1. Wrist pain, acute, right    Patient with acute onset R wrist pain with rubor/calor/appearance of active synovitis.  She is on MTX. XR wrist in [facility], films reviewed by me: no fracture or dislocation.   Referred to her Rheumatologist for further evaluation. May take Tylenol or Tramadol for pain in the interim. No NSAIDs because of effect on MTX which she is already taking for arthritis. She voices understanding of this.      Willeen Niece, MD 02/06/16 [phone]

## 2019-12-31 ENCOUNTER — Ambulatory Visit: Payer: Non-veteran care | Attending: Internal Medicine

## 2020-01-14 ENCOUNTER — Ambulatory Visit: Payer: Non-veteran care

## 2020-01-14 ENCOUNTER — Ambulatory Visit: Payer: Medicare Other | Attending: Internal Medicine

## 2020-01-14 DIAGNOSIS — Z23 Encounter for immunization: Secondary | ICD-10-CM

## 2020-01-14 NOTE — Progress Notes (Signed)
   Covid-19 Vaccination Clinic  Name:  [name]    MRN: [id number] DOB: [birth year]  01/14/2020  [name] was observed post Covid-19 immunization for 15 minutes without incident. She was provided with Vaccine Information Sheet and instruction to access the V-Safe system.   [name] was instructed to call 911 with any severe reactions post vaccine: Marland Kitchen Difficulty breathing  . Swelling of face and throat  . A fast heartbeat  . A bad rash all over body  . Dizziness and weakness   Immunizations Administered    Name Date Dose VIS Date Route   Pfizer COVID-19 Vaccine 01/14/2020  1:12 PM 0.3 mL 09/10/2019 Intramuscular   Manufacturer: [place]   Lot: [id number]   [place]: [id number]

## 2020-02-07 ENCOUNTER — Ambulatory Visit: Payer: Medicare Other | Attending: Internal Medicine

## 2020-02-07 ENCOUNTER — Ambulatory Visit: Payer: Non-veteran care

## 2020-02-07 DIAGNOSIS — Z23 Encounter for immunization: Secondary | ICD-10-CM

## 2020-02-07 NOTE — Progress Notes (Signed)
   Covid-19 Vaccination Clinic  Name:  TAVARIA SPERBECK    MRN: [id number] DOB: 03-08-1966  02/07/2020  Ms. Salvetti was observed post Covid-19 immunization for 15 minutes without incident. She was provided with Vaccine Information Sheet and instruction to access the V-Safe system.   Ms. Trinh was instructed to call 911 with any severe reactions post vaccine: Marland Kitchen Difficulty breathing  . Swelling of face and throat  . A fast heartbeat  . A bad rash all over body  . Dizziness and weakness   Immunizations Administered    Name Date Dose VIS Date Route   Pfizer COVID-19 Vaccine 02/07/2020 11:50 AM 0.3 mL 11/24/2018 Intramuscular   Manufacturer: [place]   Lot: [id number]   [place]: [id number]

## 2020-12-15 ENCOUNTER — Encounter: Payer: Self-pay | Admitting: Internal Medicine

## 2020-12-15 ENCOUNTER — Encounter: Payer: Self-pay | Admitting: Physical Medicine and Rehabilitation

## 2021-02-09 ENCOUNTER — Other Ambulatory Visit: Payer: Self-pay

## 2021-02-09 ENCOUNTER — Encounter: Payer: Self-pay | Admitting: Physical Medicine and Rehabilitation

## 2021-02-09 ENCOUNTER — Encounter
Payer: No Typology Code available for payment source | Attending: Physical Medicine and Rehabilitation | Admitting: Physical Medicine and Rehabilitation

## 2021-02-09 VITALS — BP 133/84 | HR 69 | Temp 98.8°F | Ht 71.0 in | Wt 185.2 lb

## 2021-02-09 DIAGNOSIS — R519 Headache, unspecified: Secondary | ICD-10-CM

## 2021-02-09 DIAGNOSIS — M5481 Occipital neuralgia: Secondary | ICD-10-CM

## 2021-02-09 DIAGNOSIS — G8929 Other chronic pain: Secondary | ICD-10-CM | POA: Diagnosis present

## 2021-02-09 DIAGNOSIS — M7918 Myalgia, other site: Secondary | ICD-10-CM | POA: Diagnosis present

## 2021-02-09 NOTE — Progress Notes (Signed)
Subjective:    Patient ID: [name], female    DOB: [birth year], 55 y.o.   MRN: [id number]  HPI  Patient is a 54 yr old female with hx of "arthritis"- on MTX for that and depression; here for evaluation of neck pain and headaches.   Has had neck pain and HA's Started noticing it- doing more computer work.  Has Headaches- B/L- occpital Will radiate to top of head.  No light/sound sensitivity with HA or nausea.   In back /posterior neck and back of head-  Throbbing/gripping head.   [facility] Helped a little with tennis balls, but forgets to do.   Tried: Flexeril- made sleepy- no improvement Tylenol- not help- keeps hoping it will help Ibuprofen- not helpful Never had occipital nerve block Never had trigger point injections either.  Never tried nerve pain meds.   Pain Inventory Average Pain 7 Pain Right Now 7 My pain is constant gripping tightness  In the last 24 hours, has pain interfered with the following? General activity 4 Relation with others 4 Enjoyment of life 4 What TIME of day is your pain at its worst? evening and night Sleep (in general) Fair  Pain is worse with: bending Pain improves with: resting head back or tilting head back Relief from Meds: 1  walk without assistance ability to climb steps?  yes do you drive?  yes  disabled: date disabled 2011 retired  No problems in this area  Any changes since last visit?  no  Primary care Theodis Sato MD ([place])    No family history on file. Social History   Socioeconomic History  . Marital status: Single    Spouse name: Not on file  . Number of children: Not on file  . Years of education: Not on file  . Highest education level: Not on file  Occupational History  . Not on file  Tobacco Use  . Smoking status: Never Smoker  . Smokeless tobacco: Never Used  Substance and Sexual Activity  . Alcohol use: No  . Drug use: No  . Sexual activity: Not on file  Other  Topics Concern  . Not on file  Social History Narrative  . Not on file   Social Determinants of Health   Financial Resource Strain: Not on file  Food Insecurity: Not on file  Transportation Needs: Not on file  Physical Activity: Not on file  Stress: Not on file  Social Connections: Not on file   Past Surgical History:  Procedure Laterality Date  . ABDOMINAL HYSTERECTOMY    . BACK SURGERY    . LUMBAR FUSION    . uterine fibroids removed     Past Medical History:  Diagnosis Date  . DVT (deep venous thrombosis) ([place])   . Heart disease   . Hypertension   . Pulmonary embolism ([place])   . Ruptured lumbar disc    BP 133/84   Pulse 69   Temp 98.8 F (37.1 C)   Ht 5\' 11"  (1.803 m)   Wt 185 lb 3.2 oz (84 kg)   SpO2 97%   BMI 25.83 kg/m   Opioid Risk Score:   Fall Risk Score:  `1  Depression screen PHQ 2/9  Depression screen PHQ 2/9 02/09/2021  Decreased Interest 0  Down, Depressed, Hopeless 0  PHQ - 2 Score 0  Altered sleeping 0  Tired, decreased energy 1  Change in appetite 0  Feeling bad or failure about yourself  0  Trouble concentrating 0  Moving slowly or fidgety/restless 0  Suicidal thoughts 0  PHQ-9 Score 1   Review of Systems  Constitutional: Negative.   HENT: Negative.   Eyes: Negative.   Respiratory: Negative.   Cardiovascular: Negative.   Gastrointestinal: Negative.   Endocrine: Negative.   Genitourinary: Negative.   Musculoskeletal: Positive for neck pain.  Allergic/Immunologic: Negative.   Neurological: Negative.   Hematological: Negative.   Psychiatric/Behavioral: Negative.   All other systems reviewed and are negative.      Objective:   Physical Exam Awake, alert, appropriate, sitting on table, NAD TTP splenius capitus B/L- also trigger points in upper traps, scalenes- posterior B/L and levators B/L (likely secondary myofascial trigger points)       Assessment & Plan:    Patient is a 55 yr old female with hx of "arthritis"- on  MTX for that and depression; here for evaluation of neck pain and headaches. 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  1.  Suggest occipital nerve block B/L  To help control headaches and neck pain 2. Can also do trigger point injections- would target upper traps, levators, scalenes and splenius capitus B/L can do with or without #1.  3. If headaches don't improve, can also try Topamax for non migrainous headaches for prevention/prophylaxis.   4.  Pt wants to think about plan and let me know which she wants to do.   5. F/U when have a f/u to do possible injections vs medications.    I spent a total of 35 minutes on visit- going over options- demonstrating on visual aids and discussing her options- she want to look into it.

## 2021-02-09 NOTE — Patient Instructions (Signed)
Patient is a 55 yr old female with hx of "arthritis"- on MTX for that and depression; here for evaluation of neck pain and headaches. 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  1.  Suggest occipital nerve block B/L  To help control headaches and neck pain 2. Can also do trigger point injections- would target upper traps, levators, scalenes and splenius capitus B/L can do with or without #1.  3. If headaches don't improve, can also try Topamax for non migrainous headaches for prevention/prophylaxis.   4.  Pt wants to think about plan and let me know which she wants to do.   5. F/U when have a f/u to do possible injections vs medications.

## 2021-02-21 ENCOUNTER — Encounter: Payer: No Typology Code available for payment source | Admitting: Physical Medicine and Rehabilitation

## 2021-03-07 ENCOUNTER — Encounter: Payer: Self-pay | Admitting: Physical Medicine and Rehabilitation

## 2021-03-07 ENCOUNTER — Encounter
Payer: No Typology Code available for payment source | Attending: Physical Medicine and Rehabilitation | Admitting: Physical Medicine and Rehabilitation

## 2021-03-07 ENCOUNTER — Other Ambulatory Visit: Payer: Self-pay

## 2021-03-07 VITALS — BP 161/95 | HR 50 | Temp 98.8°F | Ht 71.0 in | Wt 183.2 lb

## 2021-03-07 DIAGNOSIS — R519 Headache, unspecified: Secondary | ICD-10-CM

## 2021-03-07 DIAGNOSIS — G8929 Other chronic pain: Secondary | ICD-10-CM | POA: Diagnosis present

## 2021-03-07 DIAGNOSIS — M5481 Occipital neuralgia: Secondary | ICD-10-CM

## 2021-03-07 DIAGNOSIS — M7918 Myalgia, other site: Secondary | ICD-10-CM | POA: Diagnosis present

## 2021-03-07 NOTE — Progress Notes (Signed)
Patient is a 55 yr old female with hx of "arthritis"- on MTX for that and depression; here for evaluation of neck pain and headaches. 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   Things going about the same-  Still having the HA's.   Using theracane.  Used on neck- made it a little sore.  Also used on shoulders- but hurt "MORE" on neck - so stopped doing.     Has decided wants to do trigger point injections.     Plan: Spent a prolonged time going over risks/benefits before pt decided to do them.  1. Patient here for trigger point injections for headaches  Consent done and on chart.  Cleaned areas with alcohol and injected using a 27 gauge 1.5 inch needle  Injected 2cc Using 1% Lidocaine with no EPI  Upper traps B/L Levators B/L  Posterior scalenes B/L Middle scalenes Splenius Capitus B/L Pectoralis Major Rhomboids Infraspinatus Teres Major/minor Thoracic paraspinals Lumbar paraspinals Other injections-    Patient's level of pain prior was 7/10 Current level of pain after injections is- 5/10  There was no bleeding or complications.  Patient was advised to drink a lot of water on day after injections to flush system Will have increased soreness for 12-48 hours after injections.  Can use Lidocaine patches the day AFTER injections Can use theracane on day of injections in places didn't inject Can use heating pad 4-6 hours AFTER injections   2. Went over stretching exercises and to use theracane- at least 3x/week.   3. Also to strengthen muscles- pull shoulders back and drop them- hold for 5 seconds- do 10 reps and do 1-2x/day.   4. Every 15 minutes look up from work and do some ROM of shoulders and neck. Suggest that it wil slow tightening of the muscles.    5. F/U 3 months

## 2021-03-07 NOTE — Patient Instructions (Signed)
1. Patient here for trigger point injections for headaches  Consent done and on chart.  Cleaned areas with alcohol and injected using a 27 gauge 1.5 inch needle  Injected 2cc Using 1% Lidocaine with no EPI  Upper traps B/L Levators B/L  Posterior scalenes B/L Middle scalenes Splenius Capitus B/L Pectoralis Major Rhomboids Infraspinatus Teres Major/minor Thoracic paraspinals Lumbar paraspinals Other injections-    Patient's level of pain prior was 7/10 Current level of pain after injections is- 5/10  There was no bleeding or complications.  Patient was advised to drink a lot of water on day after injections to flush system Will have increased soreness for 12-48 hours after injections.  Can use Lidocaine patches the day AFTER injections Can use theracane on day of injections in places didn't inject Can use heating pad 4-6 hours AFTER injections   2. Went over stretching exercises and to use theracane- at least 3x/week.   3. Also to strengthen muscles- pull shoulders back and drop them- hold for 5 seconds- do 10 reps and do 1-2x/day.   4. Every 15 minutes look up from work and do some ROM of shoulders and neck. Suggest that it wil slow tightening of the muscles.    5. F/U 3 months

## 2021-06-08 ENCOUNTER — Encounter: Payer: No Typology Code available for payment source | Admitting: Physical Medicine and Rehabilitation

## 2021-06-29 ENCOUNTER — Encounter: Payer: Self-pay | Admitting: Physical Medicine and Rehabilitation

## 2021-06-29 ENCOUNTER — Other Ambulatory Visit: Payer: Self-pay

## 2021-06-29 ENCOUNTER — Encounter
Payer: No Typology Code available for payment source | Attending: Physical Medicine and Rehabilitation | Admitting: Physical Medicine and Rehabilitation

## 2021-06-29 VITALS — BP 160/95 | HR 50 | Temp 98.4°F | Ht 71.0 in | Wt 184.8 lb

## 2021-06-29 DIAGNOSIS — G8929 Other chronic pain: Secondary | ICD-10-CM | POA: Diagnosis present

## 2021-06-29 DIAGNOSIS — M7918 Myalgia, other site: Secondary | ICD-10-CM | POA: Diagnosis present

## 2021-06-29 DIAGNOSIS — R519 Headache, unspecified: Secondary | ICD-10-CM | POA: Insufficient documentation

## 2021-06-29 DIAGNOSIS — M5481 Occipital neuralgia: Secondary | ICD-10-CM | POA: Diagnosis not present

## 2021-06-29 NOTE — Patient Instructions (Signed)
Plan: Patient here for trigger point injections for occipital neuralgia- wants ot wait on occipital nerve block.   Consent done and on chart.  Cleaned areas with alcohol and injected using a 27 gauge 1.5 inch needle  Injected  Using 1% Lidocaine with no EPI  Upper traps B/L Levators B/L  Posterior scalenes B/L  Middle scalenes Splenius Capitus B/L  Pectoralis Major Rhomboids Infraspinatus Teres Major/minor Thoracic paraspinals Lumbar paraspinals Other injections-   Patient's level of pain prior was 7/10 Current level of pain after injections is 7/10- no change so far.   There was no bleeding or complications.  Patient was advised to drink a lot of water on day after injections to flush system Will have increased soreness for 12-48 hours after injections.  Can use Lidocaine patches the day AFTER injections Can use theracane on day of injections in places didn't inject Can use heating pad 4-6 hours AFTER injections   2. Wants to wait on occipital nerve blocks- went over risks and benefits- and how can help- she wants ot do more research into it.   3. F/U 3 months- will think about occipital nerve block.

## 2021-06-29 NOTE — Progress Notes (Signed)
Patient is a 55 yr old female with hx of "arthritis"- on MTX for that and depression; here for evaluation of neck pain and headaches. 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   Felt like injections helped some- but didn't help HA's the way she thought it would- helped shoulder and neck pain, but not the HA's-   We discussed occipital nerve block again- she doesn't want to get as of yet   Plan: Patient here for trigger point injections for occipital neuralgia- wants ot wait on occipital nerve block.   Consent done and on chart.  Cleaned areas with alcohol and injected using a 27 gauge 1.5 inch needle  Injected  Using 1% Lidocaine with no EPI  Upper traps B/L Levators B/L  Posterior scalenes B/L  Middle scalenes Splenius Capitus B/L  Pectoralis Major Rhomboids Infraspinatus Teres Major/minor Thoracic paraspinals Lumbar paraspinals Other injections-   Patient's level of pain prior was 7/10 Current level of pain after injections is 7/10- no change so far.   There was no bleeding or complications.  Patient was advised to drink a lot of water on day after injections to flush system Will have increased soreness for 12-48 hours after injections.  Can use Lidocaine patches the day AFTER injections Can use theracane on day of injections in places didn't inject Can use heating pad 4-6 hours AFTER injections   2. Wants to wait on occipital nerve blocks- went over risks and benefits- and how can help- she wants ot do more research into it.   3. F/U 3 months- will think about occipital nerve block.

## 2021-09-28 ENCOUNTER — Encounter: Payer: Self-pay | Admitting: Physical Medicine and Rehabilitation

## 2021-09-28 ENCOUNTER — Other Ambulatory Visit: Payer: Self-pay

## 2021-09-28 ENCOUNTER — Encounter
Payer: No Typology Code available for payment source | Attending: Physical Medicine and Rehabilitation | Admitting: Physical Medicine and Rehabilitation

## 2021-09-28 VITALS — BP 145/85 | HR 69 | Temp 99.1°F | Ht 71.0 in | Wt 189.0 lb

## 2021-09-28 DIAGNOSIS — M7918 Myalgia, other site: Secondary | ICD-10-CM | POA: Diagnosis present

## 2021-09-28 DIAGNOSIS — R519 Headache, unspecified: Secondary | ICD-10-CM | POA: Diagnosis not present

## 2021-09-28 DIAGNOSIS — G8929 Other chronic pain: Secondary | ICD-10-CM | POA: Diagnosis not present

## 2021-09-28 DIAGNOSIS — M5481 Occipital neuralgia: Secondary | ICD-10-CM

## 2021-09-28 NOTE — Patient Instructions (Signed)
Plan: Patient here for trigger point injections for  Consent done and on chart.  Cleaned areas with alcohol and injected using a 27 gauge 1.5 inch needle  Injected 2.5 Using 1% Lidocaine with no EPI  Upper traps B/L  Levators- B/L  Posterior scalenes Middle scalenes- R only on scalene; L looser Splenius Capitus- B/L  Pectoralis Major Rhomboids Infraspinatus Teres Major/minor Thoracic paraspinals Lumbar paraspinals Other injections-    Patient's level of pain prior was  7-8/10 Current level of pain after injections is- 4/10 or so  There was no bleeding or complications.  Patient was advised to drink a lot of water on day after injections to flush system Will have increased soreness for 12-48 hours after injections.  Can use Lidocaine patches the day AFTER injections Can use theracane on day of injections in places didn't inject Can use heating pad 4-6 hours AFTER injections  2. Spent additional time going over risks and benefit of trigger point injections vs occipital nerve blocks- and explaining don't need driver afterwards; only real difference is the medicine- add Kenalog to the injections which calms down the nerve- no increase in blood sugar because pt not diabetic.   3. F/U in 3 months for possible occipital nerve block vs trigger point injections.

## 2021-09-28 NOTE — Progress Notes (Signed)
Patient is a 55 yr old female with hx of "arthritis"- on MTX for that and depression; here for evaluation of neck pain and headaches. 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  .   Injections helped back of head the muscle tightness.  Just came back over the last 2 weeks or so.    Wants trigger point injections not occipital nerve block today- wants to do more research.      Plan: Patient here for trigger point injections for  Consent done and on chart.  Cleaned areas with alcohol and injected using a 27 gauge 1.5 inch needle  Injected 2.5 Using 1% Lidocaine with no EPI  Upper traps B/L  Levators- B/L  Posterior scalenes Middle scalenes- R only on scalene; L looser Splenius Capitus- B/L  Pectoralis Major Rhomboids Infraspinatus Teres Major/minor Thoracic paraspinals Lumbar paraspinals Other injections-    Patient's level of pain prior was  7-8/10 Current level of pain after injections is- 4/10 or so  There was no bleeding or complications.  Patient was advised to drink a lot of water on day after injections to flush system Will have increased soreness for 12-48 hours after injections.  Can use Lidocaine patches the day AFTER injections Can use theracane on day of injections in places didn't inject Can use heating pad 4-6 hours AFTER injections  2. Spent additional time going over risks and benefit of trigger point injections vs occipital nerve blocks- and explaining don't need driver afterwards; only real difference is the medicine- add Kenalog to the injections which calms down the nerve- no increase in blood sugar because pt not diabetic.   3. F/U in 3 months for possible occipital nerve block vs trigger point injections.

## 2021-12-24 ENCOUNTER — Encounter: Payer: Self-pay | Admitting: Physical Medicine and Rehabilitation

## 2021-12-24 ENCOUNTER — Encounter
Payer: No Typology Code available for payment source | Attending: Physical Medicine and Rehabilitation | Admitting: Physical Medicine and Rehabilitation

## 2021-12-24 ENCOUNTER — Other Ambulatory Visit: Payer: Self-pay

## 2021-12-24 VITALS — BP 145/93 | HR 58 | Temp 98.3°F | Ht 71.0 in | Wt 181.0 lb

## 2021-12-24 DIAGNOSIS — R519 Headache, unspecified: Secondary | ICD-10-CM

## 2021-12-24 DIAGNOSIS — G8929 Other chronic pain: Secondary | ICD-10-CM | POA: Diagnosis present

## 2021-12-24 DIAGNOSIS — M5481 Occipital neuralgia: Secondary | ICD-10-CM | POA: Diagnosis not present

## 2021-12-24 DIAGNOSIS — M7918 Myalgia, other site: Secondary | ICD-10-CM | POA: Diagnosis present

## 2021-12-24 NOTE — Progress Notes (Signed)
Patient is a 56 yr old female with hx of "arthritis"- on MTX for that and depression; here for evaluation of neck pain and headaches. 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  ?Here for f/u on HA's.  ? ?Wants trigger point injections- wants to wait on occipital nerve blocks still- doesn't want to try anything new.  ? ?Trigger point injections are helpful.  ? ?Has a lot going on right now so doesn't want to try anything new.  ? ? ?Plan: ?1. Patient here for trigger point injections for ? Consent done and on chart. ? ?Cleaned areas with alcohol and injected using a 27 gauge 1.5 inch needle ? ?Injected 2.5cc ?Using 1% Lidocaine with no EPI ? ?Upper traps- B/L  ?Levators- B/L  ?Posterior scalenes ?Middle scalenes ?Splenius Capitus- B/L  ?Pectoralis Major ?Rhomboids ?Infraspinatus ?Teres Major/minor ?Thoracic paraspinals ?Lumbar paraspinals ?Other injections-  ? ? ?Patient's level of pain prior was  8/10 ?Current level of pain after injections is about the same- but looser ? ?There was no bleeding or complications. ? ?Patient was advised to drink a lot of water on day after injections to flush system ?Will have increased soreness for 12-48 hours after injections.  ?Can use Lidocaine patches the day AFTER injections ?Can use theracane on day of injections in places didn't inject ?Can use heating pad 4-6 hours AFTER injections ? ?2. F/U in 3 months for trigger point injections or occpitial nerve blocks.  ? ?3. Can also use lidocaine cream over the counter-  ? ? ? ?

## 2021-12-24 NOTE — Patient Instructions (Signed)
Plan: ?1. Patient here for trigger point injections for ? Consent done and on chart. ? ?Cleaned areas with alcohol and injected using a 27 gauge 1.5 inch needle ? ?Injected 2.5cc ?Using 1% Lidocaine with no EPI ? ?Upper traps- B/L  ?Levators- B/L  ?Posterior scalenes ?Middle scalenes ?Splenius Capitus- B/L  ?Pectoralis Major ?Rhomboids ?Infraspinatus ?Teres Major/minor ?Thoracic paraspinals ?Lumbar paraspinals ?Other injections-  ? ? ?Patient's level of pain prior was  8/10 ?Current level of pain after injections is about the same- but looser ? ?There was no bleeding or complications. ? ?Patient was advised to drink a lot of water on day after injections to flush system ?Will have increased soreness for 12-48 hours after injections.  ?Can use Lidocaine patches the day AFTER injections ?Can use theracane on day of injections in places didn't inject ?Can use heating pad 4-6 hours AFTER injections ? ?2. F/U in 3 months for trigger point injections or occpitial nerve blocks.  ? ?3. Can also use lidocaine cream over the counter-  ? ?

## 2022-04-05 ENCOUNTER — Encounter: Payer: Self-pay | Admitting: Physical Medicine and Rehabilitation

## 2022-04-05 ENCOUNTER — Encounter
Payer: Medicare Other | Attending: Physical Medicine and Rehabilitation | Admitting: Physical Medicine and Rehabilitation

## 2022-04-05 VITALS — BP 135/78 | HR 68 | Temp 98.7°F | Ht 71.0 in | Wt 182.0 lb

## 2022-04-05 DIAGNOSIS — M7918 Myalgia, other site: Secondary | ICD-10-CM | POA: Diagnosis present

## 2022-04-05 DIAGNOSIS — M5481 Occipital neuralgia: Secondary | ICD-10-CM | POA: Diagnosis not present

## 2022-04-05 DIAGNOSIS — R519 Headache, unspecified: Secondary | ICD-10-CM

## 2022-04-05 DIAGNOSIS — G8929 Other chronic pain: Secondary | ICD-10-CM | POA: Insufficient documentation

## 2022-04-05 NOTE — Progress Notes (Signed)
Patient is a 56 yr old female with hx of "arthritis"- on MTX for that and depression; here for evaluation of neck pain and headaches. 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   Wants trigger point injections- not occipital nerve block.     Everything going about the same.  Trigger point injections are lasting a good while- started to feel worse starting the beginning of this week.  The pressure was starting ot build up.    Plan: Patient here for trigger point injections for  Consent done and on chart.  Cleaned areas with alcohol and injected using a 27 gauge 1.5 inch needle  Injected 2.5cc Using 1% Lidocaine with no EPI  Upper traps- B/L  Levators- B/L  Posterior scalenes Middle scalenes- B/L  Splenius Capitus- B/ L Pectoralis Major Rhomboids Infraspinatus Teres Major/minor Thoracic paraspinals Lumbar paraspinals Other injections-    Patient's level of pain prior was 7/10 Current level of pain after injections is- head feels heavy- and feels 8/10 due to needling.   There was no bleeding or complications.  Patient was advised to drink a lot of water on day after injections to flush system Will have increased soreness for 12-48 hours after injections.  Can use Lidocaine patches the day AFTER injections Can use theracane on day of injections in places didn't inject Can use ice or heating pad 4-6 hours AFTER injections   2. F?U in 3 months- TrP injections.

## 2022-04-05 NOTE — Patient Instructions (Signed)
Plan: Patient here for trigger point injections for  Consent done and on chart.  Cleaned areas with alcohol and injected using a 27 gauge 1.5 inch needle  Injected 2.5cc Using 1% Lidocaine with no EPI  Upper traps- B/L  Levators- B/L  Posterior scalenes Middle scalenes- B/L  Splenius Capitus- B/ L Pectoralis Major Rhomboids Infraspinatus Teres Major/minor Thoracic paraspinals Lumbar paraspinals Other injections-    Patient's level of pain prior was 7/10 Current level of pain after injections is- head feels heavy- and feels 8/10 due to needling.   There was no bleeding or complications.  Patient was advised to drink a lot of water on day after injections to flush system Will have increased soreness for 12-48 hours after injections.  Can use Lidocaine patches the day AFTER injections Can use theracane on day of injections in places didn't inject Can use ice or heating pad 4-6 hours AFTER injections   2. F?U in 3 months- TrP injections.

## 2022-07-29 ENCOUNTER — Encounter: Payer: Non-veteran care | Admitting: Physical Medicine and Rehabilitation

## 2022-12-06 ENCOUNTER — Encounter: Payer: Self-pay | Admitting: Physical Medicine and Rehabilitation

## 2022-12-06 ENCOUNTER — Encounter
Payer: No Typology Code available for payment source | Attending: Physical Medicine and Rehabilitation | Admitting: Physical Medicine and Rehabilitation

## 2022-12-06 VITALS — BP 136/87 | HR 60 | Ht 71.0 in | Wt 179.0 lb

## 2022-12-06 DIAGNOSIS — G8929 Other chronic pain: Secondary | ICD-10-CM | POA: Insufficient documentation

## 2022-12-06 DIAGNOSIS — M5481 Occipital neuralgia: Secondary | ICD-10-CM

## 2022-12-06 DIAGNOSIS — M7918 Myalgia, other site: Secondary | ICD-10-CM

## 2022-12-06 DIAGNOSIS — R519 Headache, unspecified: Secondary | ICD-10-CM

## 2022-12-06 MED ORDER — TOPIRAMATE 25 MG PO TABS: 25.0000 mg | ORAL_TABLET | Freq: Every day | ORAL | 5 refills | Status: DC

## 2022-12-06 MED ORDER — LIDOCAINE HCL 1 % IJ SOLN
3.0000 mL | Freq: Once | INTRAMUSCULAR | Status: AC
  Administered 2022-12-06: 3 mL

## 2022-12-06 NOTE — Progress Notes (Signed)
Subjective:    Patient ID: [name], female    DOB: [birth year], 57 y.o.   MRN: [id number]  HPI   Patient is a 57 yr old female with hx of "arthritis"- on MTX for that and depression; here for evaluation of neck pain and headaches. Has occipital neuralgia and secondary myofascial trigger points      Here for f/u on occipital neuralgia  HA's the same Using heat- home made heating pad- - bath cloth soaks with alcohol and puts in microwave and heats it up.   Has had Rx for Imitrex-  But doesn't remember taking.   Has a constant HA per pt.  Asking if there's any other choices for occipital neuralgia.      Pain Inventory Average Pain 10 Pain Right Now 6 My pain is aching  In the last 24 hours, has pain interfered with the following? General activity 4 Relation with others 0 Enjoyment of life 4 What TIME of day is your pain at its worst? morning , evening, and night Sleep (in general) Fair  Pain is worse with:  . Pain improves with: heat/ice and medication Relief from Meds: 5  No family history on file. Social History   Socioeconomic History   Marital status: Single    Spouse name: Not on file   Number of children: Not on file   Years of education: Not on file   Highest education level: Not on file  Occupational History   Not on file  Tobacco Use   Smoking status: Never   Smokeless tobacco: Never  Vaping Use   Vaping Use: Never used  Substance and Sexual Activity   Alcohol use: No   Drug use: No   Sexual activity: Not on file  Other Topics Concern   Not on file  Social History Narrative   Not on file   Social Determinants of Health   Financial Resource Strain: Not on file  Food Insecurity: Not on file  Transportation Needs: Not on file  Physical Activity: Not on file  Stress: Not on file  Social Connections: Not on file   Past Surgical History:  Procedure Laterality Date   ABDOMINAL HYSTERECTOMY     BACK SURGERY     LUMBAR FUSION      uterine fibroids removed     Past Surgical History:  Procedure Laterality Date   ABDOMINAL HYSTERECTOMY     BACK SURGERY     LUMBAR FUSION     uterine fibroids removed     Past Medical History:  Diagnosis Date   DVT (deep venous thrombosis) (HCC)    Heart disease    Hypertension    Pulmonary embolism (HCC)    Ruptured lumbar disc    BP 136/87 (BP Location: Left Arm, Patient Position: Sitting, Cuff Size: Large)   Pulse 60   Ht '5\' 11"'$  (1.803 m)   Wt 179 lb (81.2 kg)   SpO2 98%   BMI 24.97 kg/m   Opioid Risk Score:   Fall Risk Score:  `1  Depression screen PHQ 2/9     04/05/2022    2:09 PM 12/24/2021    1:10 PM 06/29/2021    1:41 PM 02/09/2021    1:14 PM  Depression screen PHQ 2/9  Decreased Interest 0 0 0 0  Down, Depressed, Hopeless 0 0 0 0  PHQ - 2 Score 0 0 0 0  Altered sleeping    0  Tired, decreased energy    1  Change in appetite    0  Feeling bad or failure about yourself     0  Trouble concentrating    0  Moving slowly or fidgety/restless    0  Suicidal thoughts    0  PHQ-9 Score    1     Review of Systems  Neurological:        Head  All other systems reviewed and are negative.     Objective:   Physical Exam        Assessment & Plan:    Patient is a 57 yr old female with hx of "arthritis"- on MTX for that and depression; here for evaluation of neck pain and headaches. Has occipital neuralgia and secondary myofascial trigger points    Still wants to think about Occipital nerve block- in same place do trigger point injections on back of head, but also has steroid/kenalog in it.    2. Can also try Topamax- which is used to prevent tension or migraine headaches-  but not used as frequently for occipital neuralgia- because standard of care is the occipital nerve block that is discussed above.  - 25 mg nightly x 1 week Then 50 mg nightly x 1 week 75 mg  nightly x 1 weke Then 100 mg nightly - for headache prevention - originally was designed as  seizure medicine-   3. Imitex-Sumitriptan- is a medicine you've gotten a prescription for it in past 12/22- can only take as needed- usually only get 12 pills/month per [place] and federal guidelines.    4. Patient here for trigger point injections for  Consent done and on chart.  Cleaned areas with alcohol and injected using a 27 gauge 1.5 inch needle  Injected 3cc- none wasted Using 1% Lidocaine with no EPI  Upper traps- B/L  Levators- b/L  Posterior scalenes Middle scalenes- B/L  Splenius Capitus- B/L  Pectoralis Major Rhomboids Infraspinatus Teres Major/minor Thoracic paraspinals Lumbar paraspinals Other injections-    Patient's level of pain prior was 7-8/10 Current level of pain after injections is- similar  There was no bleeding or complications.  Patient was advised to drink a lot of water on day after injections to flush system Will have increased soreness for 12-48 hours after injections.  Can use Lidocaine patches the day AFTER injections Can use theracane on day of injections in places didn't inject Can use heating pad 4-6 hours AFTER injections   5. Can look up theracane and youtube and neck to see how to use the cane you have for tight muscles.   6. F/U in 3 months- let me know if would like ot try any of the options above.     I spent a total of  31  minutes on total care today- >50% coordination of care- due to 10 minutes on injections- and 21 minutes discussing options for HA prevention

## 2022-12-06 NOTE — Patient Instructions (Addendum)
Patient is a 57 yr old female with hx of "arthritis"- on MTX for that and depression; here for evaluation of neck pain and headaches. Has occipital neuralgia and secondary myofascial trigger points    Still wants to think about Occipital nerve block- in same place do trigger point injections on back of head, but also has steroid/kenalog in it.    2. Can also try Topamax- which is used to prevent tension or migraine headaches-  but not used as frequently for occipital neuralgia- because standard of care is the occipital nerve block that is discussed above.  - 25 mg nightly x 1 week Then 50 mg nightly x 1 week 75 mg  nightly x 1 weke Then 100 mg nightly - for headache prevention - originally was designed as seizure medicine-   3. Imitex-Sumitriptan- is a medicine you've gotten a prescription for it in past 12/22- can only take as needed- usually only get 12 pills/month per [place] and federal guidelines.    4. Patient here for trigger point injections for  Consent done and on chart.  Cleaned areas with alcohol and injected using a 27 gauge 1.5 inch needle  Injected 3cc- none wasted Using 1% Lidocaine with no EPI  Upper traps- B/L  Levators- b/L  Posterior scalenes Middle scalenes- B/L  Splenius Capitus- B/L  Pectoralis Major Rhomboids Infraspinatus Teres Major/minor Thoracic paraspinals Lumbar paraspinals Other injections-    Patient's level of pain prior was 7-8/10 Current level of pain after injections is- similar  There was no bleeding or complications.  Patient was advised to drink a lot of water on day after injections to flush system Will have increased soreness for 12-48 hours after injections.  Can use Lidocaine patches the day AFTER injections Can use theracane on day of injections in places didn't inject Can use heating pad 4-6 hours AFTER injections   5. Can look up theracane and youtube and neck to see how to use the cane you have for tight muscles.   6. F/U  in 3 months- let me know if would like ot try any of the options above.

## 2022-12-06 NOTE — Addendum Note (Signed)
Addended by: Jasmine December T on: 12/06/2022 03:08 PM   Modules accepted: Orders

## 2023-03-17 ENCOUNTER — Encounter: Payer: Self-pay | Admitting: Physical Medicine and Rehabilitation

## 2023-03-17 ENCOUNTER — Encounter
Payer: No Typology Code available for payment source | Attending: Physical Medicine and Rehabilitation | Admitting: Physical Medicine and Rehabilitation

## 2023-03-17 VITALS — BP 157/87 | HR 54 | Ht 71.0 in | Wt 175.0 lb

## 2023-03-17 DIAGNOSIS — M5481 Occipital neuralgia: Secondary | ICD-10-CM | POA: Insufficient documentation

## 2023-03-17 DIAGNOSIS — M7918 Myalgia, other site: Secondary | ICD-10-CM | POA: Insufficient documentation

## 2023-03-17 MED ORDER — TOPIRAMATE 25 MG PO TABS: 25.0000 mg | ORAL_TABLET | Freq: Every day | ORAL | 5 refills | Status: DC

## 2023-03-17 NOTE — Progress Notes (Signed)
Patient is a 57 yr old female with hx of "arthritis"- on MTX for that and depression; here for evaluation of neck pain and headaches. Has occipital neuralgia and secondary myofascial trigger points  Has been losing weight lately- just trying to keep her weight up- right now.   HA's have still been bothering her- never got Topamax for some reason.   Asking about options for HA's.     Plan: Never received Topamax- was sent in at last visit-  will send in again.  25 mg nightly x 1 week, then  50 mg nightly- x 1 week, then 75 mg nightly x 1 week then 100 mg nightly-  To prevent headaches Went over side effect profile= word finding, weight loss, nausea and depression- rare side effects   2.   Referral to Neurology- to see if they have any other ideas- for occipital neuralgia base don Sx's.    3. Imitrex - wants to wait to get Rx for it.    4. Patient here for trigger point injections for  Consent done and on chart.  Cleaned areas with alcohol and injected using a 27 gauge 1.5 inch needle  Injected  Using 1% Lidocaine with no EPI  Upper traps- B/L  Levators- B/L  Posterior scalenes Middle scalenes- B/L  Splenius Capitus- B/L  Pectoralis Major Rhomboids Infraspinatus Teres Major/minor Thoracic paraspinals Lumbar paraspinals Other injections-    Patient's level of pain prior was Current level of pain after injections is  There was no bleeding or complications.  Patient was advised to drink a lot of water on day after injections to flush system Will have increased soreness for 12-48 hours after injections.  Can use Lidocaine patches the day AFTER injections Can use theracane on day of injections in places didn't inject Can use heating pad 4-6 hours AFTER injections  5. BP was 157/87 this afternoon- was concerned- but having it rechecked.   6. F/U- in 3 months- for Trp injections and f/u on HA's.     I spent a total of 42   minutes on total care today- >50%  coordination of care- due to - 8 minutes on injections-r est discussing options/plan as above.

## 2023-03-17 NOTE — Patient Instructions (Signed)
Plan: Never received Topamax- was sent in at last visit-  will send in again.  25 mg nightly x 1 week, then  50 mg nightly- x 1 week, then 75 mg nightly x 1 week then 100 mg nightly-  To prevent headaches Went over side effect profile= word finding, weight loss, nausea and depression- rare side effects   2.   Referral to Neurology- to see if they have any other ideas- for occipital neuralgia base don Sx's.    3. Imitrex - wants to wait to get Rx for it.    4. Patient here for trigger point injections for  Consent done and on chart.  Cleaned areas with alcohol and injected using a 27 gauge 1.5 inch needle  Injected  Using 1% Lidocaine with no EPI  Upper traps- B/L  Levators- B/L  Posterior scalenes Middle scalenes- B/L  Splenius Capitus- B/L  Pectoralis Major Rhomboids Infraspinatus Teres Major/minor Thoracic paraspinals Lumbar paraspinals Other injections-    Patient's level of pain prior was Current level of pain after injections is  There was no bleeding or complications.  Patient was advised to drink a lot of water on day after injections to flush system Will have increased soreness for 12-48 hours after injections.  Can use Lidocaine patches the day AFTER injections Can use theracane on day of injections in places didn't inject Can use heating pad 4-6 hours AFTER injections  5. BP was 157/87 this afternoon- was concerned- but having it rechecked.   6. F/U- in 3 months- for Trp injections and f/u on HA's.

## 2023-05-28 ENCOUNTER — Other Ambulatory Visit: Payer: Self-pay | Admitting: *Deleted

## 2023-05-28 MED ORDER — TOPIRAMATE 100 MG PO TABS: 100.0000 mg | ORAL_TABLET | Freq: Every day | ORAL | 1 refills | Status: AC

## 2023-05-28 NOTE — Telephone Encounter (Signed)
Patient requesting topiramate be sent to [facility]. [facility].

## 2023-06-16 ENCOUNTER — Encounter: Payer: Self-pay | Admitting: Physical Medicine and Rehabilitation

## 2023-06-16 ENCOUNTER — Encounter
Payer: No Typology Code available for payment source | Attending: Physical Medicine and Rehabilitation | Admitting: Physical Medicine and Rehabilitation

## 2023-06-16 ENCOUNTER — Telehealth: Payer: Self-pay | Admitting: *Deleted

## 2023-06-16 VITALS — BP 145/84 | HR 63 | Ht 71.0 in | Wt 173.6 lb

## 2023-06-16 DIAGNOSIS — M5481 Occipital neuralgia: Secondary | ICD-10-CM

## 2023-06-16 DIAGNOSIS — M7918 Myalgia, other site: Secondary | ICD-10-CM

## 2023-06-16 DIAGNOSIS — G8929 Other chronic pain: Secondary | ICD-10-CM | POA: Diagnosis not present

## 2023-06-16 DIAGNOSIS — R519 Headache, unspecified: Secondary | ICD-10-CM | POA: Insufficient documentation

## 2023-06-16 MED ORDER — LIDOCAINE HCL 1 % IJ SOLN
3.0000 mL | Freq: Once | INTRAMUSCULAR | Status: AC
  Administered 2023-06-16: 2 mL

## 2023-06-16 MED ORDER — TOPIRAMATE 25 MG PO TABS: 25.0000 mg | ORAL_TABLET | Freq: Every day | ORAL | 0 refills | Status: AC

## 2023-06-16 MED ORDER — TOPIRAMATE 25 MG PO TABS: 25.0000 mg | ORAL_TABLET | Freq: Every day | ORAL | 5 refills | Status: AC

## 2023-06-16 NOTE — Telephone Encounter (Signed)
VM left at 3:48 from [facility] Needs clarification on Topiramate sig. Tried to call but didn't get an answer. Left vm office would try to contact in the AM.

## 2023-06-16 NOTE — Progress Notes (Signed)
Subjective:    Patient ID: [name], female    DOB: [birth year], 57 y.o.   MRN: [id number]  HPI  Patient is a 57 yr old female with hx of "arthritis"- on MTX for that and depression; here for evaluation of neck pain and headaches. Has occipital neuralgia and secondary myofascial trigger points Here for f/u on occipital neuralgia.     Was too expensive- to get Topamax.  At  [facility]   Doesn't like side effects of Kenalog- so would like rather use the Topamax instead.   Would like to get trigger point injections again today- usually last ~ 2 months then tightness comes back.   Being more consistent using tennis ball and theracane- usually using 1x/month honestly.  When pain gets really bad, uses theracane.   Makes homemade  ice pack or heating pad- that really helps as well.   [facility]'t [facility]- [facility] they will hopefully send her at [place].     Pain Inventory Average Pain 8 Pain Right Now 8 My pain is dull and aching  In the last 24 hours, has pain interfered with the following? General activity 3 Relation with others 3 Enjoyment of life 3 What TIME of day is your pain at its worst? morning  and evening Sleep (in general) Fair  Pain is worse with: some activites Pain improves with: heat/ice, therapy/exercise, and medication Relief from Meds: 5  No family history on file. Social History   Socioeconomic History   Marital status: Single    Spouse name: Not on file   Number of children: Not on file   Years of education: Not on file   Highest education level: Not on file  Occupational History   Not on file  Tobacco Use   Smoking status: Never   Smokeless tobacco: Never  Vaping Use   Vaping status: Never Used  Substance and Sexual Activity   Alcohol use: No   Drug use: No   Sexual activity: Not on file  Other Topics Concern   Not on file  Social History Narrative   Not on file   Social Determinants of Health   Financial Resource  Strain: Not on file  Food Insecurity: Not on file  Transportation Needs: Not on file  Physical Activity: Not on file  Stress: Not on file  Social Connections: Not on file   Past Surgical History:  Procedure Laterality Date   ABDOMINAL HYSTERECTOMY     BACK SURGERY     LUMBAR FUSION     uterine fibroids removed     Past Surgical History:  Procedure Laterality Date   ABDOMINAL HYSTERECTOMY     BACK SURGERY     LUMBAR FUSION     uterine fibroids removed     Past Medical History:  Diagnosis Date   DVT (deep venous thrombosis) (HCC)    Heart disease    Hypertension    Pulmonary embolism (HCC)    Ruptured lumbar disc    BP (!) 157/87   Pulse 63   Ht 5\' 11"  (1.803 m)   Wt 173 lb 9.6 oz (78.7 kg)   SpO2 98%   BMI 24.21 kg/m   Opioid Risk Score:   Fall Risk Score:  `1  Depression screen PHQ 2/9     06/16/2023    2:11 PM 04/05/2022    2:09 PM 12/24/2021    1:10 PM 06/29/2021    1:41 PM 02/09/2021    1:14 PM  Depression screen PHQ 2/9  Decreased Interest 0 0 0 0 0  Down, Depressed, Hopeless 0 0 0 0 0  PHQ - 2 Score 0 0 0 0 0  Altered sleeping     0  Tired, decreased energy     1  Change in appetite     0  Feeling bad or failure about yourself      0  Trouble concentrating     0  Moving slowly or fidgety/restless     0  Suicidal thoughts     0  PHQ-9 Score     1     Review of Systems  Constitutional: Negative.   HENT: Negative.    Eyes: Negative.   Respiratory: Negative.    Cardiovascular: Negative.   Gastrointestinal: Negative.   Endocrine: Negative.   Genitourinary: Negative.   Musculoskeletal:  Positive for neck pain and neck stiffness.  Skin: Negative.   Allergic/Immunologic: Negative.   Neurological: Negative.   Hematological: Negative.   Psychiatric/Behavioral: Negative.    All other systems reviewed and are negative.      Objective:   Physical Exam  Awake, alert, appropriate, NAD Sitting up on table Trigger points in neck B/L  Esp  occiput       Assessment & Plan:    Patient is a 57 yr old female with hx of "arthritis"- on MTX for that and depression; here for evaluation of neck pain and headaches. Has occipital neuralgia and secondary myofascial trigger points   I think Tylenol- takes 500 mg ~1x/week- but makes her sleepy.   2.  Can use ibuprofen but no more than 5 days/week. To reduce chance of rebound headache.   3. Decided against occipital nerve block-   4. Patient here for trigger point injections for  Consent done and on chart.  Cleaned areas with alcohol and injected using a 27 gauge 1.5 inch needle  Injected 2cc- wasted 1 cc Using 1% Lidocaine with no EPI  Upper traps- B/L  Levators- B/L  Posterior scalenes- R only Middle scalenes B/L  Splenius Capitus- B/L  Pectoralis Major Rhomboids Infraspinatus Teres Major/minor Thoracic paraspinals Lumbar paraspinals Other injections-      There was no bleeding or complications.  Patient was advised to drink a lot of water on day after injections to flush system Will have increased soreness for 12-48 hours after injections.  Can use Lidocaine patches the day AFTER injections Can use theracane on day of injections in places didn't inject Can use heating pad 4-6 hours AFTER injections  5. Sent in Topiramate- 25 mg nightly-  x 1 week, to [facility] and then sent the rst of Rx to [facility]  6. Rest of Topiramate Rx- 50 mg nightly x 1week, then 75 mg nightly- x 1 week; then 100 mg nightly- - nightly- for occipital headaches.   7. F/U in 3 months- TrP injections and f/u on occpital nerve headaches   I spent a total of 30   minutes on total care today- >50% coordination of care- due to 5 minutes on injections-r est d/w pt meds- and different pahrmacies- reiteration of plan.

## 2023-06-16 NOTE — Addendum Note (Signed)
Addended by: Doreene Eland on: 06/16/2023 03:02 PM   Modules accepted: Orders

## 2023-06-16 NOTE — Patient Instructions (Signed)
Patient is a 57 yr old female with hx of "arthritis"- on MTX for that and depression; here for evaluation of neck pain and headaches. Has occipital neuralgia and secondary myofascial trigger points   I think Tylenol- takes 500 mg ~1x/week- but makes her sleepy.   2.  Can use ibuprofen but no more than 5 days/week. To reduce chance of rebound headache.   3. Decided against occipital nerve block-   4. Patient here for trigger point injections for  Consent done and on chart.  Cleaned areas with alcohol and injected using a 27 gauge 1.5 inch needle  Injected 2cc- wasted 1 cc Using 1% Lidocaine with no EPI  Upper traps- B/L  Levators- B/L  Posterior scalenes- R only Middle scalenes B/L  Splenius Capitus- B/L  Pectoralis Major Rhomboids Infraspinatus Teres Major/minor Thoracic paraspinals Lumbar paraspinals Other injections-      There was no bleeding or complications.  Patient was advised to drink a lot of water on day after injections to flush system Will have increased soreness for 12-48 hours after injections.  Can use Lidocaine patches the day AFTER injections Can use theracane on day of injections in places didn't inject Can use heating pad 4-6 hours AFTER injections  5. Sent in Topiramate- 25 mg nightly-  x 1 week, to [facility] and then sent the rst of Rx to [facility]  6. Rest of Topiramate Rx- 50 mg nightly x 1week, then 75 mg nightly- x 1 week; then 100 mg nightly- - nightly- for occipital headaches.   7. F/U in 3 months- TrP injections and f/u on occpital nerve headaches

## 2023-06-17 NOTE — Telephone Encounter (Signed)
90 day rx sent to member end of August. ? Is why a new prescription was sent basically with the same direction.  Attempted to call patient but her vm box is full to see if she received the August shipment.

## 2023-09-19 ENCOUNTER — Encounter
Payer: No Typology Code available for payment source | Attending: Physical Medicine and Rehabilitation | Admitting: Physical Medicine and Rehabilitation

## 2023-09-19 ENCOUNTER — Encounter: Payer: Self-pay | Admitting: Physical Medicine and Rehabilitation

## 2023-09-19 VITALS — BP 153/88 | HR 70 | Ht 71.0 in | Wt 170.0 lb

## 2023-09-19 DIAGNOSIS — M7918 Myalgia, other site: Secondary | ICD-10-CM | POA: Insufficient documentation

## 2023-09-19 DIAGNOSIS — M5481 Occipital neuralgia: Secondary | ICD-10-CM | POA: Diagnosis present

## 2023-09-19 MED ORDER — LIDOCAINE HCL 1 % IJ SOLN
3.0000 mL | Freq: Once | INTRAMUSCULAR | Status: AC
  Administered 2023-09-19: 3 mL

## 2023-09-19 NOTE — Progress Notes (Signed)
Patient is a 57 yr old female with hx of "arthritis"- on MTX for that and depression; here for evaluation of neck pain and headaches. Has occipital neuralgia and secondary myofascial trigger points Here for f/u on occipital neuralgia.     Went to Neuro at [facility]  Has gotten Cephaly from [place]- to help with HA's.  Hasn't used yet- but plans to this weekend.    Used half of Topamax- didn't like it-  Made her sleepy due to side effect of med.  So stopped it.  Didn't notice if helped HA's.  Also concerned about side effects on meds, so also stopped , sounds like before kicked in?     Plan:  Patient here for trigger point injections for  Consent done and on chart.  Cleaned areas with alcohol and injected using a 27 gauge 1.5 inch needle  Injected  2.5cc- wasted 0.5 cc Using 1% Lidocaine with no EPI  Upper traps B/L  Levators- B/L  Posterior scalenes Middle scalenes- B/L  Splenius Capitus- B/L  Pectoralis Major Rhomboids Infraspinatus Teres Major/minor Thoracic paraspinals Lumbar paraspinals Other injections-      There was no bleeding or complications.  Patient was advised to drink a lot of water on day after injections to flush system Will have increased soreness for 12-48 hours after injections.  Can use Lidocaine patches the day AFTER injections Can use theracane on day of injections in places didn't inject Can use heating pad 4-6 hours AFTER injections  2. Getting Cephaly from [place]- to use   3. Con't tennis balls- nightly   4. FU in 3months- for Tp RInjections

## 2024-01-02 ENCOUNTER — Encounter
Payer: No Typology Code available for payment source | Attending: Physical Medicine and Rehabilitation | Admitting: Physical Medicine and Rehabilitation

## 2024-01-02 ENCOUNTER — Encounter: Payer: Self-pay | Admitting: Physical Medicine and Rehabilitation

## 2024-01-02 VITALS — BP 159/84 | HR 61 | Ht 71.0 in | Wt 168.0 lb

## 2024-01-02 DIAGNOSIS — R519 Headache, unspecified: Secondary | ICD-10-CM | POA: Insufficient documentation

## 2024-01-02 DIAGNOSIS — M7918 Myalgia, other site: Secondary | ICD-10-CM | POA: Insufficient documentation

## 2024-01-02 MED ORDER — LIDOCAINE HCL 1 % IJ SOLN: 3.0000 mL | Freq: Once | INTRAMUSCULAR | Status: AC

## 2024-01-02 NOTE — Patient Instructions (Signed)
 Plan: If uses Caffeine daily- can make Migraine worse- but if uses it AS NEEDED, it can treat migraine   2. Patient here for trigger point injections for  Consent done and on chart.  Cleaned areas with alcohol and injected using a 27 gauge 1.5 inch needle  Injected  3cc- none wasted Using 1% Lidocaine with no EPI  Upper traps B/L  Levators- b/L  Posterior scalenes Middle scalenes- L Only Splenius Capitus B/L  Pectoralis Major Rhomboids Infraspinatus Teres Major/minor Thoracic paraspinals Lumbar paraspinals Other injections-    Patient's level of pain prior was Current level of pain after injections is  There was no bleeding or complications.  Patient was advised to drink a lot of water on day after injections to flush system Will have increased soreness for 12-48 hours after injections.  Can use Lidocaine patches the day AFTER injections Can use theracane on day of injections in places didn't inject Can use heating pad 4-6 hours AFTER injections  3. F/U in 3 months- for trP injections and f/u on Occipital hA's

## 2024-01-02 NOTE — Progress Notes (Signed)
 Patient is a 58 yr old female with hx of RA? on MTX for that and depression; here for evaluation of neck pain and headaches. Has occipital neuralgia and secondary myofascial trigger points Here for f/u on occipital neuralgia.  Started on antifungal medicine for L great toe- is black.  Been on it for 3 weeks- to be on it for 90 days.    Still haven't used Cephaly- doesn't think HA's are so bad and don't last long enough to make using Cephaly worth it.    Avoiding caffeine- antifungal makes caffeine stay in body longer- and HA's are worse on L side.     Not taking Topamax-  Didn't like the side effects     Plan: If uses Caffeine daily- can make Migraine worse- but if uses it AS NEEDED, it can treat migraine   2. Patient here for trigger point injections for  Consent done and on chart.  Cleaned areas with alcohol and injected using a 27 gauge 1.5 inch needle  Injected  3cc- none wasted Using 1% Lidocaine with no EPI  Upper traps B/L  Levators- b/L  Posterior scalenes Middle scalenes- L Only Splenius Capitus B/L  Pectoralis Major Rhomboids Infraspinatus Teres Major/minor Thoracic paraspinals Lumbar paraspinals Other injections-    Patient's level of pain prior was Current level of pain after injections is  There was no bleeding or complications.  Patient was advised to drink a lot of water on day after injections to flush system Will have increased soreness for 12-48 hours after injections.  Can use Lidocaine patches the day AFTER injections Can use theracane on day of injections in places didn't inject Can use heating pad 4-6 hours AFTER injections  3. F/U in 3 months- for trP injections and f/u on Occipital hA's

## 2024-04-26 ENCOUNTER — Encounter: Attending: Physical Medicine and Rehabilitation | Admitting: Physical Medicine and Rehabilitation

## 2024-04-26 DIAGNOSIS — M7918 Myalgia, other site: Secondary | ICD-10-CM | POA: Insufficient documentation

## 2024-04-26 DIAGNOSIS — R519 Headache, unspecified: Secondary | ICD-10-CM | POA: Insufficient documentation
